# Patient Record
Sex: Female | Born: 1985 | Race: Black or African American | Hispanic: No | Marital: Single | Smoking: Never smoker
Health system: Southern US, Community
[De-identification: ages and names within clinical notes are randomized; demographics above are authoritative.]

## PROBLEM LIST (undated history)

## (undated) DIAGNOSIS — F419 Anxiety disorder, unspecified: Secondary | ICD-10-CM

## (undated) HISTORY — PX: TUBAL LIGATION: SHX77

## (undated) HISTORY — PX: CHOLECYSTECTOMY: SHX55

---

## 2020-09-20 ENCOUNTER — Other Ambulatory Visit: Payer: Self-pay

## 2020-09-20 ENCOUNTER — Encounter (HOSPITAL_COMMUNITY): Payer: Self-pay

## 2020-09-20 ENCOUNTER — Emergency Department (HOSPITAL_COMMUNITY)
Admission: EM | Admit: 2020-09-20 | Discharge: 2020-09-20 | Disposition: A | Discharge status: Home / Self Care | Payer: 59 | Attending: Emergency Medicine | Admitting: Emergency Medicine

## 2020-09-20 DIAGNOSIS — K0889 Other specified disorders of teeth and supporting structures: Secondary | ICD-10-CM | POA: Diagnosis not present

## 2020-09-20 MED ORDER — AMOXICILLIN 500 MG PO CAPS: 500.0000 mg | ORAL_CAPSULE | Freq: Three times a day (TID) | ORAL | 0 refills | Status: DC

## 2020-09-20 MED ORDER — AMOXICILLIN 500 MG PO CAPS
500.0000 mg | ORAL_CAPSULE | Freq: Once | ORAL | Status: AC
  Administered 2020-09-20: 500 mg via ORAL
  Filled 2020-09-20: qty 1

## 2020-09-20 NOTE — ED Triage Notes (Signed)
Pt reports dental pain that started Sunday that is now radiating to the rest of her face.

## 2020-09-20 NOTE — ED Provider Notes (Signed)
  [facility]-EMERGENCY DEPT Provider Note   CSN: [id number] Arrival date & time: 09/20/20  0005     History Chief Complaint  Patient presents with  . Dental Pain    Tiffany Welch is a 34 y.o. female.  The history is provided by the patient.  Dental Pain Location:  Upper and lower Quality:  Aching Severity:  Moderate Onset quality:  Gradual Duration:  6 days Timing:  Intermittent Progression:  Worsening Chronicity:  New Relieved by:  Nothing Associated symptoms: congestion   Associated symptoms: no facial swelling, no fever and no gum swelling         PMH-none Soc hx - just moved to [facility] History   No obstetric history on file.     History reviewed. No pertinent family history.  Social History   Tobacco Use  . Smoking status: Not on file  Substance Use Topics  . Alcohol use: Not on file  . Drug use: Not on file    Home Medications Prior to Admission medications   Medication Sig Start Date End Date Taking? Authorizing Provider  amoxicillin (AMOXIL) 500 MG capsule Take 1 capsule (500 mg total) by mouth 3 (three) times daily. 09/20/20   Zadie Rhine, MD    Allergies    Patient has no known allergies.  Review of Systems   Review of Systems  Constitutional: Negative for fever.  HENT: Positive for congestion. Negative for facial swelling.     Physical Exam Updated Vital Signs BP (!) 146/89 (BP Location: Right Arm)   Pulse 81   Temp 97.6 F (36.4 C) (Oral)   Resp 17   Ht 1.626 m (5\' 4" )   Wt 93.9 kg   SpO2 95%   BMI 35.53 kg/m   Physical Exam CONSTITUTIONAL: Well developed/well nourished HEAD: Normocephalic/atraumatic EYES: EOMI/PERRL ENMT: Mucous membranes moist, no facial swelling, no gingival abscess No trismus, no stridor.  Uvula midline without erythema or exudate No dental tenderness NECK: supple no meningeal signs LUNGS:  no apparent distress NEURO: Pt is awake/alert/appropriate, moves all  extremitiesx4.  No facial droop.   EXTREMITIES:  full ROM SKIN: warm, color normal PSYCH: no abnormalities of mood noted, alert and oriented to situation  ED Results / Procedures / Treatments   Labs (all labs ordered are listed, but only abnormal results are displayed) Labs Reviewed - No data to display  EKG None  Radiology No results found.  Procedures Procedures  Medications Ordered in ED Medications  amoxicillin (AMOXIL) capsule 500 mg (has no administration in time range)    ED Course  I have reviewed the triage vital signs and the nursing notes.      MDM Rules/Calculators/A&P                          Patient reports recent sinus infection now having upper and lower dental pain.  Also is having ear pain at times. Will treat with antibiotics as could be sinusitis that is worsening No obvious dental abscess Safe for d/c home  Final Clinical Impression(s) / ED Diagnoses Final diagnoses:  Pain, dental    Rx / DC Orders ED Discharge Orders         Ordered    amoxicillin (AMOXIL) 500 MG capsule  3 times daily        09/20/20 0357           14/04/21, MD 09/20/20 [phone]

## 2020-09-25 ENCOUNTER — Other Ambulatory Visit: Payer: Self-pay

## 2020-09-25 ENCOUNTER — Ambulatory Visit (HOSPITAL_COMMUNITY)
Admission: RE | Admit: 2020-09-25 | Discharge: 2020-09-25 | Disposition: A | Discharge status: Home / Self Care | Payer: 59 | Source: Ambulatory Visit | Attending: Family Medicine | Admitting: Family Medicine

## 2020-09-25 ENCOUNTER — Encounter (HOSPITAL_COMMUNITY): Payer: Self-pay

## 2020-09-25 VITALS — BP 103/63 | HR 87 | Temp 98.4°F | Resp 18

## 2020-09-25 DIAGNOSIS — N760 Acute vaginitis: Secondary | ICD-10-CM

## 2020-09-25 MED ORDER — FLUCONAZOLE 150 MG PO TABS: ORAL_TABLET | ORAL | 0 refills | Status: DC

## 2020-09-25 NOTE — ED Triage Notes (Signed)
Pt presents with vaginal irritation and itching following an antibiotic regimen for her tooth.

## 2020-09-25 NOTE — ED Notes (Signed)
Patient was discharged by dr hagler

## 2020-09-30 NOTE — ED Provider Notes (Signed)
  [facility]   [id number] 09/25/20 Arrival Time: 1839  ASSESSMENT & PLAN:  1. Acute vaginitis     Meds ordered this encounter  Medications  . fluconazole (DIFLUCAN) 150 MG tablet    Sig: Take one tablet by mouth as a single dose. May repeat in 3 days if symptoms persist.    Dispense:  2 tablet    Refill:  0   Without s/s of PID.  No STI testing desired.  Reviewed expectations re: course of current medical issues. Questions answered. Outlined signs and symptoms indicating need for more acute intervention. Patient verbalized understanding. After Visit Summary given.   SUBJECTIVE:  Tiffany Welch is a 34 y.o. female who reports that she is currently taking antibiotic. Feels she is getting a vaginal yeast infection which happens freq with antibiotic use. No abd pain, fever, n/v. Otherwise well. No OTC tx.  Patient's last menstrual period was 08/29/2020.   OBJECTIVE:  Vitals:   09/25/20 1916  BP: 103/63  Pulse: 87  Resp: 18  Temp: 98.4 F (36.9 C)  TempSrc: Oral  SpO2: 96%     General appearance: alert, cooperative, appears stated age and no distress Lungs: unlabored respirations; speaks full sentences without difficulty Abdomen: soft GU: deferred Skin: warm and dry Psychological: alert and cooperative; normal mood and affect.  No results found for this or any previous visit.  Labs Reviewed - No data to display  Allergies  Allergen Reactions  . Codeine   . Doxycycline     History reviewed. No pertinent past medical history. Family History  Family history unknown: Yes   Social History   Socioeconomic History  . Marital status: Single    Spouse name: Not on file  . Number of children: Not on file  . Years of education: Not on file  . Highest education level: Not on file  Occupational History  . Not on file  Tobacco Use  . Smoking status: Never Smoker  . Smokeless tobacco: Not on file  Substance and Sexual Activity  . Alcohol use: Not  on file  . Drug use: Not on file  . Sexual activity: Not on file  Other Topics Concern  . Not on file  Social History Narrative  . Not on file   Social Determinants of Health   Financial Resource Strain: Not on file  Food Insecurity: Not on file  Transportation Needs: Not on file  Physical Activity: Not on file  Stress: Not on file  Social Connections: Not on file  Intimate Partner Violence: Not on file          Mardella Layman, MD 09/30/20 0930

## 2020-10-09 ENCOUNTER — Encounter (HOSPITAL_COMMUNITY): Payer: Self-pay

## 2020-10-09 ENCOUNTER — Other Ambulatory Visit: Payer: Self-pay

## 2020-10-09 ENCOUNTER — Ambulatory Visit (HOSPITAL_COMMUNITY)
Admission: RE | Admit: 2020-10-09 | Discharge: 2020-10-09 | Disposition: A | Discharge status: Home / Self Care | Payer: 59 | Source: Ambulatory Visit | Attending: Emergency Medicine | Admitting: Emergency Medicine

## 2020-10-09 VITALS — BP 124/89 | HR 67 | Temp 98.2°F | Resp 18

## 2020-10-09 DIAGNOSIS — N898 Other specified noninflammatory disorders of vagina: Secondary | ICD-10-CM | POA: Insufficient documentation

## 2020-10-09 LAB — POCT URINALYSIS DIPSTICK, ED / UC
Bilirubin Urine: NEGATIVE
Glucose, UA: NEGATIVE mg/dL
Hgb urine dipstick: NEGATIVE
Ketones, ur: NEGATIVE mg/dL
Nitrite: NEGATIVE
Protein, ur: NEGATIVE mg/dL
Specific Gravity, Urine: 1.015 (ref 1.005–1.030)
Urobilinogen, UA: 0.2 mg/dL (ref 0.0–1.0)
pH: 7 (ref 5.0–8.0)

## 2020-10-09 MED ORDER — METRONIDAZOLE 500 MG PO TABS: 500.0000 mg | ORAL_TABLET | Freq: Two times a day (BID) | ORAL | 0 refills | Status: AC

## 2020-10-09 NOTE — ED Provider Notes (Signed)
[facility]  ____________________________________________  Time seen: Approximately 8:22 PM  I have reviewed the triage vital signs and the nursing notes.   HISTORY  Chief Complaint Vaginal Discharge and Appointment   Historian Patient     HPI Tiffany Welch is a 34 y.o. female presents to the urgent care with vaginal pruritus, irritation and changes in vaginal discharge.  Patient states that her current symptoms feel similar to episodes of BV she has had in the past.  She denies dyspareunia, fever, chills, dysuria or increased urinary frequency.  No low back pain.  No nausea or vomiting.  She has been in a monogamous relationship for a year with a single partner.   History reviewed. No pertinent past medical history.   Immunizations up to date:  Yes.     History reviewed. No pertinent past medical history.  There are no problems to display for this patient.   Past Surgical History:  Procedure Laterality Date  . CHOLECYSTECTOMY    . TUBAL LIGATION      Prior to Admission medications   Medication Sig Start Date End Date Taking? Authorizing Provider  amoxicillin (AMOXIL) 500 MG capsule Take 1 capsule (500 mg total) by mouth 3 (three) times daily. 09/20/20   Zadie Rhine, MD  fluconazole (DIFLUCAN) 150 MG tablet Take one tablet by mouth as a single dose. May repeat in 3 days if symptoms persist. 09/25/20   Mardella Layman, MD  metroNIDAZOLE (FLAGYL) 500 MG tablet Take 1 tablet (500 mg total) by mouth 2 (two) times daily for 7 days. 10/09/20 10/16/20  Orvil Feil, PA-C    Allergies Codeine and Doxycycline  Family History  Family history unknown: Yes    Social History Social History   Tobacco Use  . Smoking status: Never Smoker     Review of Systems  Constitutional: No fever/chills Eyes:  No discharge ENT: No upper respiratory complaints. Respiratory: no cough. No SOB/ use of accessory muscles to breath Gastrointestinal:   No nausea, no  vomiting.  No diarrhea.  No constipation. Genitourinary: Patient has vaginal irritation.  Musculoskeletal: Negative for musculoskeletal pain. Skin: Negative for rash, abrasions, lacerations, ecchymosis.    ____________________________________________   PHYSICAL EXAM:  VITAL SIGNS: ED Triage Vitals  Enc Vitals Group     BP 10/09/20 1853 124/89     Pulse Rate 10/09/20 1853 67     Resp 10/09/20 1852 18     Temp 10/09/20 1853 98.2 F (36.8 C)     Temp Source 10/09/20 1852 Oral     SpO2 10/09/20 1853 99 %     Weight --      Height --      Head Circumference --      Peak Flow --      Pain Score 10/09/20 1851 5     Pain Loc --      Pain Edu? --      Excl. in GC? --      Constitutional: Alert and oriented. Well appearing and in no acute distress. Eyes: Conjunctivae are normal. PERRL. EOMI. Head: Atraumatic. Cardiovascular: Normal rate, regular rhythm. Normal S1 and S2.  Good peripheral circulation. Respiratory: Normal respiratory effort without tachypnea or retractions. Lungs CTAB. Good air entry to the bases with no decreased or absent breath sounds Gastrointestinal: Bowel sounds x 4 quadrants. Soft and nontender to palpation. No guarding or rigidity. No distention. Genitourinary: No rash.  No erythema or edema of the labia majora or minora. Musculoskeletal: Full range of  motion to all extremities. No obvious deformities noted Neurologic:  Normal for age. No gross focal neurologic deficits are appreciated.  Skin:  Skin is warm, dry and intact. No rash noted. Psychiatric: Mood and affect are normal for age. Speech and behavior are normal.   ____________________________________________   LABS (all labs ordered are listed, but only abnormal results are displayed)  Labs Reviewed  POCT URINALYSIS DIPSTICK, ED / UC - Abnormal; Notable for the following components:      Result Value   Leukocytes,Ua SMALL (*)    All other components within normal limits  CERVICOVAGINAL  ANCILLARY ONLY   ____________________________________________  EKG   ____________________________________________  RADIOLOGY  No results found.  ____________________________________________    PROCEDURES  Procedure(s) performed:     Procedures     Medications - No data to display   ____________________________________________   INITIAL IMPRESSION / ASSESSMENT AND PLAN / ED COURSE  Pertinent labs & imaging results that were available during my care of the patient were reviewed by me and considered in my medical decision making (see chart for details).     Assessment and plan Vaginal irritation 34 year old female presents to the urgent care with vaginal irritation and changes in vaginal discharge for the past 2 to 3 days.  Vital signs were reassuring at triage.  Testing for gonorrhea, chlamydia, yeast and BV are in process at this time.  Patient was recently treated for yeast vaginitis.  She states that her current symptoms feel similar to episodes of BV in the past.  Will start patient on Flagyl while vaginal testing is in process.  Inform patient that we would update her management plan when results return.  All patient questions were answered.     ____________________________________________  FINAL CLINICAL IMPRESSION(S) / ED DIAGNOSES  Final diagnoses:  Vaginal irritation      NEW MEDICATIONS STARTED DURING THIS VISIT:  ED Discharge Orders         Ordered    metroNIDAZOLE (FLAGYL) 500 MG tablet  2 times daily        10/09/20 1950              This chart was dictated using voice recognition software/Dragon. Despite best efforts to proofread, errors can occur which can change the meaning. Any change was purely unintentional.     Orvil Feil, PA-C 10/09/20 2025

## 2020-10-09 NOTE — ED Triage Notes (Signed)
Pt presents with vaginal irritation, burning, and abnormal discharge for past few days.

## 2020-10-09 NOTE — Discharge Instructions (Signed)
Take Flagyl twice daily for seven days.  

## 2020-10-13 LAB — CERVICOVAGINAL ANCILLARY ONLY
Bacterial Vaginitis (gardnerella): NEGATIVE
Candida Glabrata: NEGATIVE
Candida Vaginitis: NEGATIVE
Chlamydia: NEGATIVE
Comment: NEGATIVE
Comment: NEGATIVE
Comment: NEGATIVE
Comment: NEGATIVE
Comment: NEGATIVE
Comment: NORMAL
Neisseria Gonorrhea: NEGATIVE
Trichomonas: NEGATIVE

## 2020-11-01 ENCOUNTER — Emergency Department (HOSPITAL_COMMUNITY)
Admission: EM | Admit: 2020-11-01 | Discharge: 2020-11-01 | Disposition: A | Discharge status: Home / Self Care | Payer: 59 | Attending: Emergency Medicine | Admitting: Emergency Medicine

## 2020-11-01 ENCOUNTER — Other Ambulatory Visit: Payer: Self-pay

## 2020-11-01 ENCOUNTER — Encounter (HOSPITAL_COMMUNITY): Payer: Self-pay | Admitting: Emergency Medicine

## 2020-11-01 DIAGNOSIS — R059 Cough, unspecified: Secondary | ICD-10-CM | POA: Diagnosis present

## 2020-11-01 DIAGNOSIS — U071 COVID-19: Secondary | ICD-10-CM | POA: Diagnosis not present

## 2020-11-01 DIAGNOSIS — J069 Acute upper respiratory infection, unspecified: Secondary | ICD-10-CM

## 2020-11-01 NOTE — ED Triage Notes (Signed)
Patient reports family is Covid+ and started having symptoms on 1/12. Requesting Covid test.

## 2020-11-01 NOTE — ED Provider Notes (Signed)
[facility]-EMERGENC[facility] DEPT Provider Note   CSN: [id number] Arrival date & time: 11/01/20  2109     History Chief Complaint  Patient presents with  . Covid Exposure    Tiffany Welch is a 35 y.o. female.  35 year old female presents with complaint of cough, shortness of breath, fevers, body aches. Patient was exposed to her daughter who tested positive for COVID earlier this week, patient's symptoms started 10/29/20. Patient is vaccinated, has not had her booster, no history of asthma or chronic lung disease and is a non smoker.   Tiffany Welch was evaluated in Emergency Department on 11/01/2020 for the symptoms described in the history of present illness. She was evaluated in the context of the global COVID-19 pandemic, which necessitated consideration that the patient might be at risk for infection with the SARS-CoV-2 virus that causes COVID-19. Institutional protocols and algorithms that pertain to the evaluation of patients at risk for COVID-19 are in a state of rapid change based on information released by regulatory bodies including the CDC and federal and state organizations. These policies and algorithms were followed during the patient's care in the ED.         History reviewed. No pertinent past medical history.  There are no problems to display for this patient.   Past Surgical History:  Procedure Laterality Date  . CHOLECYSTECTOMY    . TUBAL LIGATION       OB History   No obstetric history on file.     Family History  Family history unknown: Yes    Social History   Tobacco Use  . Smoking status: Never Smoker    Home Medications Prior to Admission medications   Medication Sig Start Date End Date Taking? Authorizing Provider  amoxicillin (AMOXIL) 500 MG capsule Take 1 capsule (500 mg total) by mouth 3 (three) times daily. 09/20/20   Tiffany Rhine, MD  fluconazole (DIFLUCAN) 150 MG tablet Take one tablet by mouth as a single dose. May  repeat in 3 days if symptoms persist. 09/25/20   Tiffany Layman, MD    Allergies    Codeine and Doxycycline  Review of Systems   Review of Systems  Constitutional: Positive for fever.  HENT: Positive for congestion.   Respiratory: Positive for cough and shortness of breath.   Gastrointestinal: Negative for diarrhea and vomiting.  Musculoskeletal: Positive for myalgias.  Skin: Negative for rash.  Allergic/Immunologic: Negative for immunocompromised state.  Neurological: Negative for weakness.  Psychiatric/Behavioral: Negative for confusion.  All other systems reviewed and are negative.   Physical Exam Updated Vital Signs BP (!) 141/93 (BP Location: Left Arm)   Pulse 95   Temp 98 F (36.7 C) (Oral)   Resp 18   Ht 5\' 4"  (1.626 m)   Wt 93.9 kg   LMP 10/31/2020   SpO2 97%   BMI 35.53 kg/m   Physical Exam Vitals and nursing note reviewed.  Constitutional:      General: She is not in acute distress.    Appearance: She is well-developed and well-nourished. She is not diaphoretic.  HENT:     Head: Normocephalic and atraumatic.  Eyes:     Conjunctiva/sclera: Conjunctivae normal.  Cardiovascular:     Rate and Rhythm: Normal rate and regular rhythm.     Heart sounds: Normal heart sounds.  Pulmonary:     Effort: Pulmonary effort is normal.     Breath sounds: Normal breath sounds.  Musculoskeletal:     Cervical back: Neck  supple.  Lymphadenopathy:     Cervical: No cervical adenopathy.  Skin:    General: Skin is warm and dry.     Findings: No erythema or rash.  Neurological:     Mental Status: She is alert and oriented to person, place, and time.  Psychiatric:        Mood and Affect: Mood and affect normal.        Behavior: Behavior normal.     ED Results / Procedures / Treatments   Labs (all labs ordered are listed, but only abnormal results are displayed) Labs Reviewed  SARS CORONAVIRUS 2 (TAT 6-24 HRS)    EKG None  Radiology No results  found.  Procedures Procedures (including critical care time)  Medications Ordered in ED Medications - No data to display  ED Course  I have reviewed the triage vital signs and the nursing notes.  Pertinent labs & imaging results that were available during my care of the patient were reviewed by me and considered in my medical decision making (see chart for details).  Clinical Course as of 11/01/20 2224  Sat Nov 01, 2020  [id number] 35 year old female with complaint of cough, SHOB, additional as above, onset 3 days ago, known COVID exposure, is vaccinated. On exam, well appearing, O2 sat 97-100% on room air without increased respiratory effort.  Offered reassurance, recommend home pulse ox, mucinex, motrin and tylenol. Return to ER for severe or concerning symptoms, home to quarantine pending test results.  [LM]    Clinical Course User Index [LM] Tiffany Welch   MDM Rules/Calculators/A&P                          Final Clinical Impression(s) / ED Diagnoses Final diagnoses:  Viral URI with cough    Rx / DC Orders ED Discharge Orders    None       Tiffany Welch 11/01/20 2224    Tiffany Files, MD 11/02/20 1006

## 2020-11-01 NOTE — Discharge Instructions (Signed)
Home to quarantine pending COVID test results which should be available in your MyChart in 24 hours.  Recommend Mucinex, Motrin, Tylenol as needed as directed. Return to ER for severe or concerning symptoms, follow up with your doctor or COVID clinic as needed.

## 2020-11-02 LAB — SARS CORONAVIRUS 2 (TAT 6-24 HRS): SARS Coronavirus 2: POSITIVE — AB

## 2020-11-03 ENCOUNTER — Telehealth: Payer: Self-pay | Admitting: Physician Assistant

## 2020-11-03 ENCOUNTER — Telehealth: Payer: Self-pay | Admitting: *Deleted

## 2020-11-03 NOTE — Telephone Encounter (Signed)
Called todiscuss with patient about COVID-19 symptoms and the use of one of the available treatments for those with mild to moderate Covid symptoms and at a high risk of hospitalization. Pt appears to qualify for outpatient treatment due to co-morbid conditions and/or a member of an at-risk group in accordance with the FDA Emergency Use Authorization.   Symptom onset: 10/29/20  Today is day 6 Vaccinated: yes Booster? no Immunocompromised? no Qualifiers: BMI>25, African American  Patient is interested in treatment. Unfortunately, pt is a single mother with young kids and no childcare  Cline Crock PA-C  [facility]  .

## 2020-11-03 NOTE — Telephone Encounter (Signed)
Called to discuss with patient about COVID-19 symptoms and the use of one of the available treatments for those with mild to moderate Covid symptoms and at a high risk of hospitalization.  Pt appears to qualify for outpatient treatment due to co-morbid conditions and/or a member of an at-risk group in accordance with the FDA Emergency Use Authorization.    Symptom onset: 10/29/20  Today is day 6 Vaccinated: yes Booster? no Immunocompromised? no Qualifiers: BMI>25, African American  Patient is interested in treatment.  Tiffany Welch

## 2021-02-16 ENCOUNTER — Ambulatory Visit: Payer: 59 | Admitting: Nurse Practitioner

## 2021-03-23 ENCOUNTER — Encounter: Payer: 59 | Admitting: Nurse Practitioner

## 2021-08-21 ENCOUNTER — Ambulatory Visit (HOSPITAL_COMMUNITY)
Admission: EM | Admit: 2021-08-21 | Discharge: 2021-08-21 | Disposition: A | Discharge status: Home / Self Care | Payer: Medicaid Other | Attending: Emergency Medicine | Admitting: Emergency Medicine

## 2021-08-21 ENCOUNTER — Other Ambulatory Visit: Payer: Self-pay

## 2021-08-21 ENCOUNTER — Encounter (HOSPITAL_COMMUNITY): Payer: Self-pay

## 2021-08-21 DIAGNOSIS — Z20822 Contact with and (suspected) exposure to covid-19: Secondary | ICD-10-CM

## 2021-08-21 DIAGNOSIS — J111 Influenza due to unidentified influenza virus with other respiratory manifestations: Secondary | ICD-10-CM | POA: Insufficient documentation

## 2021-08-21 MED ORDER — AEROCHAMBER PLUS MISC: 2 refills | Status: DC

## 2021-08-21 MED ORDER — ONDANSETRON 8 MG PO TBDP: 8.0000 mg | ORAL_TABLET | Freq: Three times a day (TID) | ORAL | 0 refills | Status: DC | PRN

## 2021-08-21 MED ORDER — OSELTAMIVIR PHOSPHATE 75 MG PO CAPS: 75.0000 mg | ORAL_CAPSULE | Freq: Two times a day (BID) | ORAL | 0 refills | Status: DC

## 2021-08-21 MED ORDER — PROMETHAZINE-DM 6.25-15 MG/5ML PO SYRP: 5.0000 mL | ORAL_SOLUTION | Freq: Four times a day (QID) | ORAL | 0 refills | Status: DC | PRN

## 2021-08-21 MED ORDER — ALBUTEROL SULFATE HFA 108 (90 BASE) MCG/ACT IN AERS: 1.0000 | INHALATION_SPRAY | RESPIRATORY_TRACT | 0 refills | Status: DC | PRN

## 2021-08-21 MED ORDER — IBUPROFEN 600 MG PO TABS: 600.0000 mg | ORAL_TABLET | Freq: Four times a day (QID) | ORAL | 0 refills | Status: DC | PRN

## 2021-08-21 NOTE — ED Provider Notes (Signed)
HPI  SUBJECTIVE:  Tiffany Welch is a 35 y.o. female who presents with 2 days of chest congestion, fevers 101.8 with chills, nasal congestion, postnasal drip, sore throat, cough productive of yellow mucus, wheezing, chest soreness secondary to the cough, diarrhea, nausea.  No body aches, headaches, rhinorrhea, loss of sense of smell or taste, shortness of breath, abdominal pain, vomiting.  She was exposed to the flu.  No known COVID exposure.  She got the second dose of COVID-vaccine.  She has not yet gotten the flu vaccine.  Her son had similar symptoms last week.  She has been alternating Tylenol and ibuprofen every 4 hours, pushing fluids, tried Delsym.  The Tylenol and ibuprofen help.  No aggravating factors.  She is sleeping okay without waking up coughing.  She took ibuprofen within 6 hours of evaluation.  She had COVID in January 2022.  LMP: Yesterday.  Denies the possibility of being pregnant.  PMD: None  History reviewed. No pertinent past medical history.  Past Surgical History:  Procedure Laterality Date   CHOLECYSTECTOMY     TUBAL LIGATION      Family History  Family history unknown: Yes    Social History   Tobacco Use   Smoking status: Never   Smokeless tobacco: Never  Substance Use Topics   Alcohol use: Not Currently   Drug use: Never    No current facility-administered medications for this encounter.  Current Outpatient Medications:    albuterol (VENTOLIN HFA) 108 (90 Base) MCG/ACT inhaler, Inhale 1-2 puffs into the lungs every 4 (four) hours as needed for wheezing or shortness of breath., Disp: 1 each, Rfl: 0   ibuprofen (ADVIL) 600 MG tablet, Take 1 tablet (600 mg total) by mouth every 6 (six) hours as needed., Disp: 30 tablet, Rfl: 0   ondansetron (ZOFRAN ODT) 8 MG disintegrating tablet, Take 1 tablet (8 mg total) by mouth every 8 (eight) hours as needed for nausea or vomiting., Disp: 20 tablet, Rfl: 0   oseltamivir (TAMIFLU) 75 MG capsule, Take 1 capsule (75 mg  total) by mouth 2 (two) times daily. X 5 days, Disp: 10 capsule, Rfl: 0   promethazine-dextromethorphan (PROMETHAZINE-DM) 6.25-15 MG/5ML syrup, Take 5 mLs by mouth 4 (four) times daily as needed for cough., Disp: 118 mL, Rfl: 0   Spacer/Aero-Holding Chambers (AEROCHAMBER PLUS) inhaler, Use with inhaler, Disp: 1 each, Rfl: 2  Allergies  Allergen Reactions   Codeine    Doxycycline      ROS  As noted in HPI.   Physical Exam  BP 127/80 (BP Location: Right Arm)   Pulse 77   Temp 98.6 F (37 C) (Oral)   Resp 18   LMP 08/19/2021 (Exact Date)   SpO2 95%   Constitutional: Well developed, well nourished, no acute distress Eyes: PERRL, EOMI, conjunctiva normal bilaterally HENT: Normocephalic, atraumatic,mucus membranes moist.  Positive nasal congestion.  Normal turbinates.  No maxillary, frontal sinus tenderness.  Large tonsils.  No erythema.  No exudates.  Uvula midline. Neck: Positive cervical lymphadenopathy Respiratory: Clear to auscultation bilaterally, no rales, no wheezing, no rhonchi.  No chest wall tenderness. Cardiovascular: Normal rate and rhythm, no murmurs, no gallops, no rubs, cap refill less than 2 seconds GI: Soft, nondistended, normal bowel sounds, nontender, no rebound, no guarding skin: No rash, skin intact Musculoskeletal: No edema, no tenderness, no deformities Neurologic: Alert & oriented x 3, CN III-XII grossly intact, no motor deficits, sensation grossly intact Psychiatric: Speech and behavior appropriate   ED Course  Medications - No data to display  Orders Placed This Encounter  Procedures   SARS CORONAVIRUS 2 (TAT 6-24 HRS) Nasopharyngeal Nasopharyngeal Swab    Standing Status:   Standing    Number of Occurrences:   1   Results for orders placed or performed during the hospital encounter of 08/21/21 (from the past 24 hour(s))  SARS CORONAVIRUS 2 (TAT 6-24 HRS) Nasopharyngeal Nasopharyngeal Swab     Status: None   Collection Time: 08/21/21  4:01 PM    Specimen: Nasopharyngeal Swab  Result Value Ref Range   SARS Coronavirus 2 NEGATIVE NEGATIVE   No results found.  ED Clinical Impression  1. Influenza   2. Encounter for laboratory testing for COVID-19 virus      ED Assessment/Plan   Presentation consistent with influenza.  Did not check a flu as it would not change management.  Discussed this with patient and she is amenable to this.  Sending home with Zofran, Tylenol/ibuprofen, Tamiflu, Promethazine DM.  Regularly scheduled albuterol inhaler with a spacer for the next 4 days, then as needed.  Checking COVID.  She will discontinue the Tamiflu if her COVID is positive.  Will provide primary care list and order assistance in finding a PMD.  ER return precautions given.  COVID-negative.  Plan as above.  Discussed labs, MDM, treatment plan, and plan for follow-up with patient Discussed sn/sx that should prompt return to the ED. patient agrees with plan.   Meds ordered this encounter  Medications   albuterol (VENTOLIN HFA) 108 (90 Base) MCG/ACT inhaler    Sig: Inhale 1-2 puffs into the lungs every 4 (four) hours as needed for wheezing or shortness of breath.    Dispense:  1 each    Refill:  0   Spacer/Aero-Holding Chambers (AEROCHAMBER PLUS) inhaler    Sig: Use with inhaler    Dispense:  1 each    Refill:  2    Please educate patient on use   ibuprofen (ADVIL) 600 MG tablet    Sig: Take 1 tablet (600 mg total) by mouth every 6 (six) hours as needed.    Dispense:  30 tablet    Refill:  0   oseltamivir (TAMIFLU) 75 MG capsule    Sig: Take 1 capsule (75 mg total) by mouth 2 (two) times daily. X 5 days    Dispense:  10 capsule    Refill:  0   promethazine-dextromethorphan (PROMETHAZINE-DM) 6.25-15 MG/5ML syrup    Sig: Take 5 mLs by mouth 4 (four) times daily as needed for cough.    Dispense:  118 mL    Refill:  0   ondansetron (ZOFRAN ODT) 8 MG disintegrating tablet    Sig: Take 1 tablet (8 mg total) by mouth every 8 (eight)  hours as needed for nausea or vomiting.    Dispense:  20 tablet    Refill:  0      *This clinic note was created using Scientist, clinical (histocompatibility and immunogenetics). Therefore, there may be occasional mistakes despite careful proofreading. ?    Domenick Gong, MD 08/22/21 [phone]

## 2021-08-21 NOTE — ED Triage Notes (Signed)
Pt reports cough, fever 101.8 F, chills and diarrhea x 1 day, Pt alternation Tylenol and Motrin, last dose 1200 today.

## 2021-08-21 NOTE — Discharge Instructions (Addendum)
Take 600 mg of ibuprofen combined with 1000 mg of Tylenol together 3-4 times a day as needed.  Start the Tamiflu today.  If your COVID comes back positive, discontinue the Tamiflu.  It will be back tomorrow.  We can consider putting you on an antiviral if your COVID is positive.  Zofran for nausea, diarrhea.  Continue pushing plenty of extra electrolyte containing fluids such as Pedialyte, Gatorade.  2 puffs from albuterol inhaler using your spacer every 4 hours for 2 days, then every 6 hours for 2 days, then as needed.  You may back off on the albuterol if you start to feel better sooner.  Promethazine DM for the cough.  Below is a list of primary care practices who are taking new patients for you to follow-up with.  [facility] internal medicine clinic Ground Floor - [facility], [address] [place], [place], [place] [address] [phone]  [facility] Primary Care at [facility] [address] [place], [place] [address] [phone]  [facility] [address] [place], [place] [address] [phone]  [facility] [phone] [address] [place] [address]  [facility]: [address] [place] [address]  [phone]  [facility]: [address] [place] [address]  [phone]  [facility] : [address] [place] [address] [phone]  [facility] Primary Care: [address] [place] [address] [phone]  [facility]: [address] [place] [address] [phone]  Dr. Oneal Grout [address] [facility] [place] [address]  [phone]  Go to www.goodrx.com  or www.costplusdrugs.com to look up your medications. This will give you a list of where you can find your prescriptions  at the most affordable prices. Or ask the pharmacist what the cash price is, or if they have any other discount programs available to help make your medication more affordable. This can be less expensive than what you would pay with insurance.

## 2021-08-22 LAB — SARS CORONAVIRUS 2 (TAT 6-24 HRS): SARS Coronavirus 2: NEGATIVE

## 2021-09-11 ENCOUNTER — Encounter (HOSPITAL_COMMUNITY): Payer: Self-pay

## 2021-09-11 ENCOUNTER — Ambulatory Visit (HOSPITAL_COMMUNITY)
Admission: EM | Admit: 2021-09-11 | Discharge: 2021-09-11 | Disposition: A | Discharge status: Home / Self Care | Payer: Medicaid Other | Attending: Emergency Medicine | Admitting: Emergency Medicine

## 2021-09-11 ENCOUNTER — Other Ambulatory Visit: Payer: Self-pay

## 2021-09-11 DIAGNOSIS — N760 Acute vaginitis: Secondary | ICD-10-CM

## 2021-09-11 LAB — POCT URINALYSIS DIPSTICK, ED / UC
Bilirubin Urine: NEGATIVE
Glucose, UA: NEGATIVE mg/dL
Nitrite: NEGATIVE
Protein, ur: NEGATIVE mg/dL
Specific Gravity, Urine: 1.025 (ref 1.005–1.030)
Urobilinogen, UA: 4 mg/dL — ABNORMAL HIGH (ref 0.0–1.0)
pH: 7 (ref 5.0–8.0)

## 2021-09-11 LAB — POC URINE PREG, ED: Preg Test, Ur: NEGATIVE

## 2021-09-11 MED ORDER — METRONIDAZOLE 0.75 % VA GEL: 1.0000 | Freq: Every day | VAGINAL | 0 refills | Status: AC

## 2021-09-11 NOTE — ED Triage Notes (Signed)
Pt presents with lower abdominal pain X 3 days with no reported urinary symptoms.

## 2021-09-11 NOTE — ED Provider Notes (Signed)
[facility]    CSN: [id number] Arrival date & time: 09/11/21  1742    HISTORY   Chief Complaint  Patient presents with   Abdominal Pain   HPI Tiffany Welch is a 35 y.o. female. Patient presents with 3 days of lower abdominal pain along with vaginal dryness.  Patient states she has had bacterial vaginitis several times in the past and symptoms today are similar.  Patient denies burning with urination, incomplete emptying, increased urinary frequency, hesitancy, genital lesion, abnormal vaginal discharge, foul-smelling discharge.  Patient states she has been trying to take more probiotics to "balance herself out".  Patient states she has not had any relief.    The history is provided by the patient.  History reviewed. No pertinent past medical history. There are no problems to display for this patient.  Past Surgical History:  Procedure Laterality Date   CHOLECYSTECTOMY     TUBAL LIGATION     OB History   No obstetric history on file.    Home Medications    Prior to Admission medications   Medication Sig Start Date End Date Taking? Authorizing Provider  metroNIDAZOLE (METROGEL) 0.75 % vaginal gel Place 1 Applicatorful vaginally at bedtime for 5 days. Abstain from sexual intercourse until treatment is complete 09/11/21 09/16/21 Yes Theadora Rama Scales, PA-C  albuterol (VENTOLIN HFA) 108 (90 Base) MCG/ACT inhaler Inhale 1-2 puffs into the lungs every 4 (four) hours as needed for wheezing or shortness of breath. 08/21/21   Domenick Gong, MD  ibuprofen (ADVIL) 600 MG tablet Take 1 tablet (600 mg total) by mouth every 6 (six) hours as needed. 08/21/21   Domenick Gong, MD  ondansetron (ZOFRAN ODT) 8 MG disintegrating tablet Take 1 tablet (8 mg total) by mouth every 8 (eight) hours as needed for nausea or vomiting. 08/21/21   Domenick Gong, MD  oseltamivir (TAMIFLU) 75 MG capsule Take 1 capsule (75 mg total) by mouth 2 (two) times daily. X 5 days 08/21/21    Domenick Gong, MD  promethazine-dextromethorphan (PROMETHAZINE-DM) 6.25-15 MG/5ML syrup Take 5 mLs by mouth 4 (four) times daily as needed for cough. 08/21/21   Domenick Gong, MD  Spacer/Aero-Holding Chambers (AEROCHAMBER PLUS) inhaler Use with inhaler 08/21/21   Domenick Gong, MD   Family History Family History  Family history unknown: Yes   Social History Social History   Tobacco Use   Smoking status: Never   Smokeless tobacco: Never  Substance Use Topics   Alcohol use: Not Currently   Drug use: Never   Allergies   Codeine and Doxycycline  Review of Systems Review of Systems Pertinent findings noted in history of present illness.   Physical Exam Triage Vital Signs ED Triage Vitals  Enc Vitals Group     BP 08/14/21 0827 (!) 147/82     Pulse Rate 08/14/21 0827 72     Resp 08/14/21 0827 18     Temp 08/14/21 0827 98.3 F (36.8 C)     Temp Source 08/14/21 0827 Oral     SpO2 08/14/21 0827 98 %     Weight --      Height --      Head Circumference --      Peak Flow --      Pain Score 08/14/21 0826 5     Pain Loc --      Pain Edu? --      Excl. in GC? --   No data found.  Updated Vital Signs BP (!) 122/55 (BP Location:  Left Arm)   Pulse 61   Temp 98.2 F (36.8 C) (Oral)   Resp 18   LMP 08/19/2021 (Exact Date)   SpO2 98%   Physical Exam Vitals and nursing note reviewed.  Constitutional:      General: She is not in acute distress.    Appearance: Normal appearance. She is not ill-appearing.  HENT:     Head: Normocephalic and atraumatic.  Eyes:     General: Lids are normal.        Right eye: No discharge.        Left eye: No discharge.     Extraocular Movements: Extraocular movements intact.     Conjunctiva/sclera: Conjunctivae normal.     Right eye: Right conjunctiva is not injected.     Left eye: Left conjunctiva is not injected.  Neck:     Trachea: Trachea and phonation normal.  Cardiovascular:     Rate and Rhythm: Normal rate and regular  rhythm.     Pulses: Normal pulses.     Heart sounds: Normal heart sounds. No murmur heard.   No friction rub. No gallop.  Pulmonary:     Effort: Pulmonary effort is normal. No accessory muscle usage, prolonged expiration or respiratory distress.     Breath sounds: Normal breath sounds. No stridor, decreased air movement or transmitted upper airway sounds. No decreased breath sounds, wheezing, rhonchi or rales.  Chest:     Chest wall: No tenderness.  Genitourinary:    Comments: Pt politely declines GU exam, pt did provide a swab for testing.   Musculoskeletal:        General: Normal range of motion.     Cervical back: Normal range of motion and neck supple. Normal range of motion.  Lymphadenopathy:     Cervical: No cervical adenopathy.  Skin:    General: Skin is warm and dry.     Findings: No erythema or rash.  Neurological:     General: No focal deficit present.     Mental Status: She is alert and oriented to person, place, and time.  Psychiatric:        Mood and Affect: Mood normal.        Behavior: Behavior normal.    Visual Acuity Right Eye Distance:   Left Eye Distance:   Bilateral Distance:    Right Eye Near:   Left Eye Near:    Bilateral Near:     UC Couse / Diagnostics / Procedures:    EKG  Radiology No results found.  Procedures Procedures (including critical care time)  UC Diagnoses / Final Clinical Impressions(s)    Final diagnoses:  Vaginitis and vulvovaginitis   I have reviewed the triage vital signs and the nursing notes.  Pertinent labs & imaging results that were available during my care of the patient were reviewed by me and considered in my medical decision making (see chart for details).    Patient is here today requesting treatment for BV.  Patient states she is had BV in the past and symptoms are similar today.  We will provide her with a prescription for metronidazole gel for 5 nights.  Patient provided vaginal swab, she is advised that  once the results are received if any further testing is needed that will be provided for her as well.  Urine dip today is concerning for dehydration.  Patient encouraged to drink more water. Patient/parent/caregiver verbalized understanding and agreement of plan as discussed.  All questions were addressed during visit.  Please see discharge instructions below for further details of plan.  ED Prescriptions     Medication Sig Dispense Auth. Provider   metroNIDAZOLE (METROGEL) 0.75 % vaginal gel Place 1 Applicatorful vaginally at bedtime for 5 days. Abstain from sexual intercourse until treatment is complete 50 g Theadora Rama Scales, PA-C      PDMP not reviewed this encounter.  Pending results:  Labs Reviewed  POCT URINALYSIS DIPSTICK, ED / UC - Abnormal; Notable for the following components:      Result Value   Ketones, ur TRACE (*)    Hgb urine dipstick SMALL (*)    Urobilinogen, UA 4.0 (*)    Leukocytes,Ua SMALL (*)    All other components within normal limits  POC URINE PREG, ED  CERVICOVAGINAL ANCILLARY ONLY     Medications Ordered in UC: Medications - No data to display  Discharge Instructions:   Discharge Instructions      Based on history provided today, I am happy to empirically treat you for bacterial vaginitis.  I believe its good idea to continue taking the probiotics, I have added a prescription for metronidazole vaginal gel to be used for the next 5 nights which is much safer to take than the metronidazole tablets because it will not cause systemic side effects.  Your urinalysis is concerning for dehydration, please be sure that you are drinking plenty of water every day, at least 80 ounces to stay well-hydrated.  The results of your STD testing today will be made available to you once received.  They will be posted to your MyChart and, if any of your results are abnormal, you will receive a phone call with those results along with further instructions regarding  treatment.       Disposition Upon Discharge:  Patient presented with an acute illness with associated systemic symptoms and significant discomfort requiring urgent management. In my opinion, this is a condition that a prudent lay person (someone who possesses an average knowledge of health and medicine) may potentially expect to result in complications if not addressed urgently such as respiratory distress, impairment of bodily function or dysfunction of bodily organs.   Routine symptom specific, illness specific and/or disease specific instructions were discussed with the patient and/or caregiver at length.   As such, the patient has been evaluated and assessed, work-up was performed and treatment was provided in alignment with urgent care protocols and evidence based medicine.  Patient/parent/caregiver has been advised that the patient may require follow up for further testing and treatment if the symptoms continue in spite of treatment, as clinically indicated and appropriate.  If the patient was tested for COVID-19, Influenza and/or RSV, then the patient/parent/guardian was advised to isolate at home pending the results of his/her diagnostic coronavirus test and potentially longer if they're positive. I have also advised pt that if his/her COVID-19 test returns positive, it's recommended to self-isolate for at least 10 days after symptoms first appeared AND until fever-free for 24 hours without fever reducer AND other symptoms have improved or resolved. Discussed self-isolation recommendations as well as instructions for household member/close contacts as per the [facility] and [place] DHHS, and also gave patient the COVID packet with this information.  Patient/parent/caregiver has been advised to return to the [facility] or PCP in 3-5 days if no better; to PCP or the Emergency Department if new signs and symptoms develop, or if the current signs or symptoms continue to change or worsen for further workup,  evaluation and treatment as clinically  indicated and appropriate  The patient will follow up with their current PCP if and as advised. If the patient does not currently have a PCP we will assist them in obtaining one.   The patient may need specialty follow up if the symptoms continue, in spite of conservative treatment and management, for further workup, evaluation, consultation and treatment as clinically indicated and appropriate.  Condition: stable for discharge home Home: take medications as prescribed; routine discharge instructions as discussed; follow up as advised.    Theadora Rama Scales, [place] 09/11/21 [phone]

## 2021-09-11 NOTE — Discharge Instructions (Addendum)
Based on history provided today, I am happy to empirically treat you for bacterial vaginitis.  I believe its good idea to continue taking the probiotics, I have added a prescription for metronidazole vaginal gel to be used for the next 5 nights which is much safer to take than the metronidazole tablets because it will not cause systemic side effects.  Your urinalysis is concerning for dehydration, please be sure that you are drinking plenty of water every day, at least 80 ounces to stay well-hydrated.  The results of your STD testing today will be made available to you once received.  They will be posted to your MyChart and, if any of your results are abnormal, you will receive a phone call with those results along with further instructions regarding treatment.

## 2021-09-14 LAB — CERVICOVAGINAL ANCILLARY ONLY
Bacterial Vaginitis (gardnerella): NEGATIVE
Candida Glabrata: NEGATIVE
Candida Vaginitis: NEGATIVE
Chlamydia: NEGATIVE
Comment: NEGATIVE
Comment: NEGATIVE
Comment: NEGATIVE
Comment: NEGATIVE
Comment: NEGATIVE
Comment: NORMAL
Neisseria Gonorrhea: NEGATIVE
Trichomonas: NEGATIVE

## 2021-11-13 ENCOUNTER — Other Ambulatory Visit: Payer: Self-pay

## 2021-11-13 ENCOUNTER — Emergency Department (HOSPITAL_BASED_OUTPATIENT_CLINIC_OR_DEPARTMENT_OTHER)
Admission: EM | Admit: 2021-11-13 | Discharge: 2021-11-13 | Disposition: A | Discharge status: Home / Self Care | Payer: Self-pay | Attending: Emergency Medicine | Admitting: Emergency Medicine

## 2021-11-13 ENCOUNTER — Encounter (HOSPITAL_BASED_OUTPATIENT_CLINIC_OR_DEPARTMENT_OTHER): Payer: Self-pay | Admitting: *Deleted

## 2021-11-13 DIAGNOSIS — Z20822 Contact with and (suspected) exposure to covid-19: Secondary | ICD-10-CM | POA: Insufficient documentation

## 2021-11-13 DIAGNOSIS — R42 Dizziness and giddiness: Secondary | ICD-10-CM | POA: Insufficient documentation

## 2021-11-13 DIAGNOSIS — R110 Nausea: Secondary | ICD-10-CM | POA: Insufficient documentation

## 2021-11-13 DIAGNOSIS — R519 Headache, unspecified: Secondary | ICD-10-CM | POA: Insufficient documentation

## 2021-11-13 LAB — CBC WITH DIFFERENTIAL/PLATELET
Abs Immature Granulocytes: 0.01 10*3/uL (ref 0.00–0.07)
Basophils Absolute: 0 10*3/uL (ref 0.0–0.1)
Basophils Relative: 0 %
Eosinophils Absolute: 0.1 10*3/uL (ref 0.0–0.5)
Eosinophils Relative: 1 %
HCT: 36.8 % (ref 36.0–46.0)
Hemoglobin: 12.2 g/dL (ref 12.0–15.0)
Immature Granulocytes: 0 %
Lymphocytes Relative: 19 %
Lymphs Abs: 1.1 10*3/uL (ref 0.7–4.0)
MCH: 30.1 pg (ref 26.0–34.0)
MCHC: 33.2 g/dL (ref 30.0–36.0)
MCV: 90.9 fL (ref 80.0–100.0)
Monocytes Absolute: 0.4 10*3/uL (ref 0.1–1.0)
Monocytes Relative: 7 %
Neutro Abs: 4.3 10*3/uL (ref 1.7–7.7)
Neutrophils Relative %: 73 %
Platelets: 257 10*3/uL (ref 150–400)
RBC: 4.05 MIL/uL (ref 3.87–5.11)
RDW: 13.2 % (ref 11.5–15.5)
WBC: 5.9 10*3/uL (ref 4.0–10.5)
nRBC: 0 % (ref 0.0–0.2)

## 2021-11-13 LAB — COMPREHENSIVE METABOLIC PANEL
ALT: 9 U/L (ref 0–44)
AST: 12 U/L — ABNORMAL LOW (ref 15–41)
Albumin: 4.1 g/dL (ref 3.5–5.0)
Alkaline Phosphatase: 58 U/L (ref 38–126)
Anion gap: 7 (ref 5–15)
BUN: 8 mg/dL (ref 6–20)
CO2: 28 mmol/L (ref 22–32)
Calcium: 8.8 mg/dL — ABNORMAL LOW (ref 8.9–10.3)
Chloride: 104 mmol/L (ref 98–111)
Creatinine, Ser: 0.66 mg/dL (ref 0.44–1.00)
GFR, Estimated: >60 mL/min (ref >60)
Glucose, Bld: 97 mg/dL (ref 70–99)
Potassium: 3.6 mmol/L (ref 3.5–5.1)
Sodium: 139 mmol/L (ref 135–145)
Total Bilirubin: 0.5 mg/dL (ref 0.3–1.2)
Total Protein: 7.2 g/dL (ref 6.5–8.1)

## 2021-11-13 LAB — PREGNANCY, URINE: Preg Test, Ur: NEGATIVE

## 2021-11-13 LAB — RESP PANEL BY RT-PCR (FLU A&B, COVID) ARPGX2
Influenza A by PCR: NEGATIVE
Influenza B by PCR: NEGATIVE
SARS Coronavirus 2 by RT PCR: NEGATIVE

## 2021-11-13 MED ORDER — SODIUM CHLORIDE 0.9 % IV BOLUS
1000.0000 mL | Freq: Once | INTRAVENOUS | Status: AC
  Administered 2021-11-13: 1000 mL via INTRAVENOUS

## 2021-11-13 MED ORDER — KETOROLAC TROMETHAMINE 15 MG/ML IJ SOLN
15.0000 mg | Freq: Once | INTRAMUSCULAR | Status: AC
  Administered 2021-11-13: 15 mg via INTRAVENOUS
  Filled 2021-11-13: qty 1

## 2021-11-13 MED ORDER — METOCLOPRAMIDE HCL 5 MG/ML IJ SOLN
10.0000 mg | Freq: Once | INTRAMUSCULAR | Status: AC
  Administered 2021-11-13: 10 mg via INTRAVENOUS
  Filled 2021-11-13: qty 2

## 2021-11-13 MED ORDER — DEXAMETHASONE SODIUM PHOSPHATE 10 MG/ML IJ SOLN
10.0000 mg | Freq: Once | INTRAMUSCULAR | Status: AC
Start: 2021-11-13 — End: 2021-11-13
  Administered 2021-11-13: 10 mg via INTRAVENOUS
  Filled 2021-11-13: qty 1

## 2021-11-13 MED ORDER — DIPHENHYDRAMINE HCL 25 MG PO CAPS
50.0000 mg | ORAL_CAPSULE | Freq: Once | ORAL | Status: AC
Start: 2021-11-13 — End: 2021-11-13
  Administered 2021-11-13: 50 mg via ORAL
  Filled 2021-11-13: qty 2

## 2021-11-13 NOTE — ED Triage Notes (Incomplete)
Arrived ems from work c/o migraine  light headed,  took ibu 600 mg,  some diarrhea,  started pieroid 2 days ago

## 2021-11-13 NOTE — ED Provider Notes (Signed)
[facility] DEPARTMENT Provider Note   CSN: [id number] Arrival date & time: 11/13/21  1152     History  Chief Complaint  Patient presents with   Migraine    Tiffany Welch is a 36 y.o. female presenting for evaluation of headache, lightheadedness/dizziness.   Patient states she woke up this morning not feeling well.  She was at work when she had a gradual onset severe headache.  She states this feels different from any headache she has had before.  She reports associated nausea, no vomiting.  Associated photophobia.  She also started to feel shaky and lightheaded, this is worse when she stands up.  She ate, drink, toxicity milligrams of ibuprofen without improvement of symptoms, has not taken anything else.  She denies recent fevers, nasal congestion, sore throat, cough, chest pain, abdominal pain, urinary symptoms, normal bowel movements.  She started her period 2 days ago, right time for her and it is heavy like it normally is.  HPI     Home Medications Prior to Admission medications   Medication Sig Start Date End Date Taking? Authorizing Provider  albuterol (VENTOLIN HFA) 108 (90 Base) MCG/ACT inhaler Inhale 1-2 puffs into the lungs every 4 (four) hours as needed for wheezing or shortness of breath. 08/21/21   Melynda Ripple, MD  ibuprofen (ADVIL) 600 MG tablet Take 1 tablet (600 mg total) by mouth every 6 (six) hours as needed. 08/21/21   Melynda Ripple, MD  ondansetron (ZOFRAN ODT) 8 MG disintegrating tablet Take 1 tablet (8 mg total) by mouth every 8 (eight) hours as needed for nausea or vomiting. 08/21/21   Melynda Ripple, MD  oseltamivir (TAMIFLU) 75 MG capsule Take 1 capsule (75 mg total) by mouth 2 (two) times daily. X 5 days 08/21/21   Melynda Ripple, MD  promethazine-dextromethorphan (PROMETHAZINE-DM) 6.25-15 MG/5ML syrup Take 5 mLs by mouth 4 (four) times daily as needed for cough. 08/21/21   Melynda Ripple, MD  Spacer/Aero-Holding Chambers  (AEROCHAMBER PLUS) inhaler Use with inhaler 08/21/21   Melynda Ripple, MD      Allergies    Codeine and Doxycycline    Review of Systems   Review of Systems  Gastrointestinal:  Positive for nausea.  Neurological:  Positive for light-headedness and headaches.  All other systems reviewed and are negative.  Physical Exam Updated Vital Signs BP (!) 99/50    Pulse 60    Temp 98.2 F (36.8 C) (Oral)    Resp 18    Ht 5\' 4"  (1.626 m)    Wt 98.4 kg    LMP 11/11/2021    SpO2 97%    BMI 37.25 kg/m  Physical Exam Vitals and nursing note reviewed.  Constitutional:      General: She is not in acute distress.    Appearance: Normal appearance.     Comments: Resting in the bed in no acute distress  HENT:     Head: Normocephalic and atraumatic.  Eyes:     Conjunctiva/sclera: Conjunctivae normal.     Pupils: Pupils are equal, round, and reactive to light.  Cardiovascular:     Rate and Rhythm: Normal rate and regular rhythm.     Pulses: Normal pulses.  Pulmonary:     Effort: Pulmonary effort is normal. No respiratory distress.     Breath sounds: Normal breath sounds. No wheezing.     Comments: Speaking in full sentences.  Clear lung sounds in all fields. Abdominal:     General: There is no distension.  Palpations: Abdomen is soft. There is no mass.     Tenderness: There is no abdominal tenderness. There is no guarding.  Musculoskeletal:        General: Normal range of motion.     Cervical back: Normal range of motion and neck supple.  Skin:    General: Skin is warm and dry.     Capillary Refill: Capillary refill takes less than 2 seconds.  Neurological:     Mental Status: She is alert and oriented to person, place, and time.     GCS: GCS eye subscore is 4. GCS verbal subscore is 5. GCS motor subscore is 6.     Cranial Nerves: Cranial nerves 2-12 are intact.     Sensory: Sensation is intact.     Comments: No focal neurologic deficits  Psychiatric:        Mood and Affect: Mood and  affect normal.        Speech: Speech normal.        Behavior: Behavior normal.    ED Results / Procedures / Treatments   Labs (all labs ordered are listed, but only abnormal results are displayed) Labs Reviewed  COMPREHENSIVE METABOLIC PANEL - Abnormal; Notable for the following components:      Result Value   Calcium 8.8 (*)    AST 12 (*)    All other components within normal limits  RESP PANEL BY RT-PCR (FLU A&B, COVID) ARPGX2  CBC WITH DIFFERENTIAL/PLATELET  PREGNANCY, URINE    EKG None  Radiology No results found.  Procedures Procedures    Medications Ordered in ED Medications  ketorolac (TORADOL) 15 MG/ML injection 15 mg (15 mg Intravenous Given 11/13/21 1359)  metoCLOPramide (REGLAN) injection 10 mg (10 mg Intravenous Given 11/13/21 1359)  diphenhydrAMINE (BENADRYL) capsule 50 mg (50 mg Oral Given 11/13/21 1359)  sodium chloride 0.9 % bolus 1,000 mL (1,000 mLs Intravenous New Bag/Given 11/13/21 1358)    ED Course/ Medical Decision Making/ A&P                           Medical Decision Making Amount and/or Complexity of Data Reviewed Labs: ordered.  Risk Prescription drug management.    This patient presents to the ED for concern of headache and lightheadedness.  This involves an extensive number of treatment options, and is a complaint that carries with it a moderate risk of complications and morbidity.  The differential diagnosis includes headache, migraine, viral illness, anemia, dehydration, sinusitis   Lab Tests:  I ordered, and personally interpreted labs.  The pertinent results include: No leukocytosis.  No anemia.  Electrolytes stable.  COVID and flu negative.  Pregnancy negative   Medicines ordered:  I ordered medication including HA cocktail  for sx controll Reevaluation of the patient after these medicines showed that the patient improved On reevaluation, patient reports pain is completely resolved.  No longer feeling lightheaded.  Tolerating  p.o. without difficulty.   Test Considered:  Patient has no neurologic deficits, doubt ICH, SAH, CVA.  Do not feel patient needs emergent CT or MRI at this time.  Disposition:  After consideration of the diagnostic results and the patients response to treatment, I feel that the patent would benefit from continued symptomatic treatment outpatient.  Discussed continued use of Tylenol or ibuprofen as needed for headache.  Follow-up with PCP as needed.  Discussed importance of hydration and eating regular meals.  At this time, patient appears safe for discharge.  Return precautions given.  Patient states she understands and agrees to plan   Final Clinical Impression(s) / ED Diagnoses Final diagnoses:  None    Rx / DC Orders ED Discharge Orders     None         Franchot Heidelberg, PA-C 11/13/21 1517    Long, Wonda Olds, MD 11/19/21 [phone]

## 2021-11-13 NOTE — Discharge Instructions (Signed)
Your work-up was overall reassuring today. Take Tylenol or ibuprofen as needed for headache. Make sure you are staying well-hydrated with water. Follow-up with your primary care doctor if your headaches return. Return to the emergency room with any new, worsening, concerning symptoms

## 2021-11-13 NOTE — ED Notes (Signed)
ED Provider at bedside. 

## 2022-02-22 ENCOUNTER — Ambulatory Visit (INDEPENDENT_AMBULATORY_CARE_PROVIDER_SITE_OTHER): Payer: Self-pay

## 2022-02-22 ENCOUNTER — Encounter (HOSPITAL_COMMUNITY): Payer: Self-pay | Admitting: Emergency Medicine

## 2022-02-22 ENCOUNTER — Ambulatory Visit (HOSPITAL_COMMUNITY)
Admission: EM | Admit: 2022-02-22 | Discharge: 2022-02-22 | Disposition: A | Discharge status: Home / Self Care | Payer: Self-pay | Attending: Internal Medicine | Admitting: Internal Medicine

## 2022-02-22 DIAGNOSIS — R079 Chest pain, unspecified: Secondary | ICD-10-CM

## 2022-02-22 DIAGNOSIS — M940 Chondrocostal junction syndrome [Tietze]: Secondary | ICD-10-CM

## 2022-02-22 DIAGNOSIS — R490 Dysphonia: Secondary | ICD-10-CM

## 2022-02-22 HISTORY — DX: Anxiety disorder, unspecified: F41.9

## 2022-02-22 LAB — CBC WITH DIFFERENTIAL/PLATELET
Abs Immature Granulocytes: 0.01 10*3/uL (ref 0.00–0.07)
Basophils Absolute: 0 10*3/uL (ref 0.0–0.1)
Basophils Relative: 1 %
Eosinophils Absolute: 0.3 10*3/uL (ref 0.0–0.5)
Eosinophils Relative: 6 %
HCT: 39.7 % (ref 36.0–46.0)
Hemoglobin: 13.1 g/dL (ref 12.0–15.0)
Immature Granulocytes: 0 %
Lymphocytes Relative: 33 %
Lymphs Abs: 1.4 10*3/uL (ref 0.7–4.0)
MCH: 30.8 pg (ref 26.0–34.0)
MCHC: 33 g/dL (ref 30.0–36.0)
MCV: 93.2 fL (ref 80.0–100.0)
Monocytes Absolute: 0.4 10*3/uL (ref 0.1–1.0)
Monocytes Relative: 10 %
Neutro Abs: 2.1 10*3/uL (ref 1.7–7.7)
Neutrophils Relative %: 50 %
Platelets: 250 10*3/uL (ref 150–400)
RBC: 4.26 MIL/uL (ref 3.87–5.11)
RDW: 13 % (ref 11.5–15.5)
WBC: 4.3 10*3/uL (ref 4.0–10.5)
nRBC: 0 % (ref 0.0–0.2)

## 2022-02-22 LAB — COMPREHENSIVE METABOLIC PANEL
ALT: 10 U/L (ref 0–44)
AST: 14 U/L — ABNORMAL LOW (ref 15–41)
Albumin: 4.2 g/dL (ref 3.5–5.0)
Alkaline Phosphatase: 59 U/L (ref 38–126)
Anion gap: 8 (ref 5–15)
BUN: 11 mg/dL (ref 6–20)
CO2: 27 mmol/L (ref 22–32)
Calcium: 9.3 mg/dL (ref 8.9–10.3)
Chloride: 104 mmol/L (ref 98–111)
Creatinine, Ser: 0.7 mg/dL (ref 0.44–1.00)
GFR, Estimated: >60 mL/min (ref >60)
Glucose, Bld: 90 mg/dL (ref 70–99)
Potassium: 3.8 mmol/L (ref 3.5–5.1)
Sodium: 139 mmol/L (ref 135–145)
Total Bilirubin: 0.6 mg/dL (ref 0.3–1.2)
Total Protein: 7.2 g/dL (ref 6.5–8.1)

## 2022-02-22 LAB — TSH: TSH: 0.565 u[IU]/mL (ref 0.350–4.500)

## 2022-02-22 LAB — VITAMIN D 25 HYDROXY (VIT D DEFICIENCY, FRACTURES): Vit D, 25-Hydroxy: 11.05 ng/mL — ABNORMAL LOW (ref 30–100)

## 2022-02-22 MED ORDER — OMEPRAZOLE 20 MG PO CPDR: 20.0000 mg | DELAYED_RELEASE_CAPSULE | Freq: Every day | ORAL | 1 refills | Status: DC

## 2022-02-22 MED ORDER — MELOXICAM 7.5 MG PO TABS: 7.5000 mg | ORAL_TABLET | Freq: Every day | ORAL | 0 refills | Status: DC

## 2022-02-22 NOTE — ED Triage Notes (Signed)
Pt reports that she has dx with anxiety over past year and having chest pains and heaviness in her back. Pt reports she recently moved from [place] and when was seen in [place] was supposed to follow up for a stress test but didn't have one.  ?  Adds that last week started having indigestion and having lots of gas. Tried taking Omeprazole for acid reflux.  ?

## 2022-02-22 NOTE — Discharge Instructions (Addendum)
Heating pad use only 20 minutes on-20 minutes off cycle ?Take take anti-inflammatory agents as needed for pain ?Gentle stretching exercises recommended ?If pain persist please return to urgent care to be reevaluated ?If you continue to gastroesophageal reflux symptoms despite omeprazole, you may need to be evaluated by gastroenterologist ?Return to urgent care if you have any other concerns ?Your chest pain is from your heart ?

## 2022-02-22 NOTE — ED Provider Notes (Addendum)
?[facility] ? ? ? ?CSN: [id number] ?Arrival date & time: 02/22/22  1204 ? ? ?  ? ?History   ?Chief Complaint ?Chief Complaint  ?Patient presents with  ? Back Pain  ? Chest Pain  ? ? ?HPI ?Tiffany Welch is a 36 y.o. female with history of gastroesophageal reflux disease previously on omeprazole comes to the urgent care with complaints of sharp left-sided chest pain worsening over the past couple of days.  Patient also complains of hoarseness of voice over the past couple of days.  Patient has had recurrent chest pain and anxiety over the past year.  She denies any previous cardiac work-up.  Today the patient describes sharp precordial chest pain of moderate severity.  Pain is associated with the patient denies any relieving factors.  She denies any NSAID use.  Patient also describes worsening gastroesophageal reflux symptoms.  She endorses hoarseness of the voice over the past couple of days.  She has had increased belching and some constipation.  No ?.  No fever or chills.  No shortness of breath or wheezing. No formal trauma to the chest.  Abdominal pain is not aggravated by fatty foods. ?HPI ? ?Past Medical History:  ?Diagnosis Date  ? Anxiety   ? ? ?There are no problems to display for this patient. ? ? ?Past Surgical History:  ?Procedure Laterality Date  ? CHOLECYSTECTOMY    ? TUBAL LIGATION    ? ? ?OB History   ?No obstetric history on file. ?  ? ? ? ?Home Medications   ? ?Prior to Admission medications   ?Medication Sig Start Date End Date Taking? Authorizing Provider  ?meloxicam (MOBIC) 7.5 MG tablet Take 1 tablet (7.5 mg total) by mouth daily. 02/22/22  Yes Quintez Maselli, Myrene Galas, MD  ?omeprazole (PRILOSEC) 20 MG capsule Take 1 capsule (20 mg total) by mouth daily. 02/22/22  Yes Charlsey Moragne, Myrene Galas, MD  ?albuterol (VENTOLIN HFA) 108 (90 Base) MCG/ACT inhaler Inhale 1-2 puffs into the lungs every 4 (four) hours as needed for wheezing or shortness of breath. 08/21/21   Melynda Ripple, MD  ?ibuprofen (ADVIL) 600  MG tablet Take 1 tablet (600 mg total) by mouth every 6 (six) hours as needed. 08/21/21   Melynda Ripple, MD  ?Spacer/Aero-Holding Chambers (AEROCHAMBER PLUS) inhaler Use with inhaler 08/21/21   Melynda Ripple, MD  ? ? ?Family History ?Family History  ?Family history unknown: Yes  ? ? ?Social History ?Social History  ? ?Tobacco Use  ? Smoking status: Never  ? Smokeless tobacco: Never  ?Substance Use Topics  ? Alcohol use: Not Currently  ? Drug use: Never  ? ? ? ?Allergies   ?Codeine and Doxycycline ? ? ?Review of Systems ?Review of Systems ?As per HPI ? ?Physical Exam ?Triage Vital Signs ?ED Triage Vitals  ?Enc Vitals Group  ?   BP 02/22/22 1328 105/67  ?   Pulse Rate 02/22/22 1328 64  ?   Resp 02/22/22 1328 17  ?   Temp 02/22/22 1328 97.8 ?F (36.6 ?C)  ?   Temp Source 02/22/22 1328 Oral  ?   SpO2 02/22/22 1328 99 %  ?   Weight --   ?   Height --   ?   Head Circumference --   ?   Peak Flow --   ?   Pain Score 02/22/22 1323 8  ?   Pain Loc --   ?   Pain Edu? --   ?   Excl. in [place]? --   ? ?  No data found. ? ?Updated Vital Signs ?BP 105/67 (BP Location: Left Arm)   Pulse 64   Temp 97.8 ?F (36.6 ?C) (Oral)   Resp 17   LMP 02/15/2022   SpO2 99%  ? ?Visual Acuity ?Right Eye Distance:   ?Left Eye Distance:   ?Bilateral Distance:   ? ?Right Eye Near:   ?Left Eye Near:    ?Bilateral Near:    ? ?Physical Exam ?Vitals and nursing note reviewed.  ?Cardiovascular:  ?   Rate and Rhythm: Normal rate and regular rhythm.  ?   Heart sounds: Normal heart sounds.  ?Pulmonary:  ?   Effort: Pulmonary effort is normal.  ?   Breath sounds: No decreased breath sounds, wheezing, rhonchi or rales.  ?Chest:  ?   Chest wall: Tenderness present.  ?   Comments: Tenderness on palpation over the left costochondral joints. ?Abdominal:  ?   General: Bowel sounds are normal. There is no abdominal bruit.  ?   Palpations: There is no hepatomegaly, splenomegaly or mass.  ?   Tenderness: There is no abdominal tenderness. There is no guarding.   ?Neurological:  ?   Mental Status: She is alert.  ? ? ? ?UC Treatments / Results  ?Labs ?(all labs ordered are listed, but only abnormal results are displayed) ?Labs Reviewed  ?CBC WITH DIFFERENTIAL/PLATELET  ?COMPREHENSIVE METABOLIC PANEL  ?TSH  ?VITAMIN D 25 HYDROXY (VIT D DEFICIENCY, FRACTURES)  ? ? ?EKG ? ? ?Radiology ?DG Chest 2 View ? ?Result Date: 02/22/2022 ?CLINICAL DATA:  Chest pain EXAM: CHEST - 2 VIEW COMPARISON:  None Available. FINDINGS: Normal heart size. Normal mediastinal contour. No pneumothorax. No pleural effusion. Lungs appear clear, with no acute consolidative airspace disease and no pulmonary edema. Cholecystectomy clips are seen in the right upper quadrant of the abdomen. IMPRESSION: No active cardiopulmonary disease. Electronically Signed   By: Ilona Sorrel M.D.   On: 02/22/2022 13:53   ? ?Procedures ?Procedures (including critical care time) ? ?Medications Ordered in UC ?Medications - No data to display ? ?Initial Impression / Assessment and Plan / UC Course  ?I have reviewed the triage vital signs and the nursing notes. ? ?Pertinent labs & imaging results that were available during my care of the patient were reviewed by me and considered in my medical decision making (see chart for details). ? ?  ?. ?1.Costochondritis: ?Gentle stretching exercises ?Heating pad use only 20 minutes on-20 mg ?Meloxicam 7.5 mg orally daily ?EKG showed normal sinus rhythm with no acute ST/T wave changes ?Return to urgent care if symptoms worsen ? ?2.  Hoarseness of voice likely secondary to gastroesophageal reflux disease: ?Omeprazole 20 mg orally daily ?Gastroenterology evaluation if patient continues to have symptoms in spite of being on omeprazole for a month. ?Return to urgent care if symptoms worsen. ? ?3.  Dizziness with generalized weakness: ?CBC, CMP, TSH, vitamin D level ?We will call patient with recommendations if labs are abnormal. ? ? ?Final Clinical Impressions(s) / UC Diagnoses  ? ?Final  diagnoses:  ?Costochondritis  ?Hoarseness of voice  ? ? ? ?Discharge Instructions   ? ?  ?Heating pad use only 20 minutes on-20 minutes off cycle ?Take take anti-inflammatory agents as needed for pain ?Gentle stretching exercises recommended ?If pain persist please return to urgent care to be reevaluated ?If you continue to gastroesophageal reflux symptoms despite omeprazole, you may need to be evaluated by gastroenterologist ?Return to urgent care if you have any other concerns ?Your chest pain is  from your heart ? ? ?ED Prescriptions   ? ? Medication Sig Dispense Auth. Provider  ? omeprazole (PRILOSEC) 20 MG capsule Take 1 capsule (20 mg total) by mouth daily. 30 capsule Ivar Domangue, Myrene Galas, MD  ? meloxicam (MOBIC) 7.5 MG tablet Take 1 tablet (7.5 mg total) by mouth daily. 20 tablet Zyia Kaneko, Myrene Galas, MD  ? ?  ? ?PDMP not reviewed this encounter. ?  ?Chase Picket, MD ?02/22/22 1448 ? ?  ?Chase Picket, MD ?02/22/22 1450 ? ?

## 2022-02-23 ENCOUNTER — Telehealth (HOSPITAL_COMMUNITY): Payer: Self-pay | Admitting: Emergency Medicine

## 2022-02-23 MED ORDER — VITAMIN D (ERGOCALCIFEROL) 1.25 MG (50000 UNIT) PO CAPS: 50000.0000 [IU] | ORAL_CAPSULE | ORAL | 0 refills | Status: AC

## 2022-06-01 ENCOUNTER — Encounter (HOSPITAL_BASED_OUTPATIENT_CLINIC_OR_DEPARTMENT_OTHER): Payer: Self-pay | Admitting: Nurse Practitioner

## 2022-06-01 ENCOUNTER — Ambulatory Visit (INDEPENDENT_AMBULATORY_CARE_PROVIDER_SITE_OTHER): Payer: Commercial Managed Care - HMO | Admitting: Nurse Practitioner

## 2022-06-01 VITALS — BP 114/70 | HR 69 | Ht 65.0 in | Wt 181.0 lb

## 2022-06-01 DIAGNOSIS — F410 Panic disorder [episodic paroxysmal anxiety] without agoraphobia: Secondary | ICD-10-CM

## 2022-06-01 DIAGNOSIS — Z0000 Encounter for general adult medical examination without abnormal findings: Secondary | ICD-10-CM | POA: Insufficient documentation

## 2022-06-01 DIAGNOSIS — F411 Generalized anxiety disorder: Secondary | ICD-10-CM

## 2022-06-01 HISTORY — DX: Encounter for general adult medical examination without abnormal findings: Z00.00

## 2022-06-01 MED ORDER — VENLAFAXINE HCL ER 37.5 MG PO CP24: ORAL_CAPSULE | ORAL | 0 refills | Status: DC

## 2022-06-01 MED ORDER — ALPRAZOLAM 0.25 MG PO TABS: ORAL_TABLET | ORAL | 0 refills | Status: DC

## 2022-06-01 NOTE — Assessment & Plan Note (Addendum)
>>  ASSESSMENT AND PLAN FOR PANIC DISORDER WRITTEN ON 06/01/2022  8:38 PM BY Jhana Giarratano E, NP  Symptoms and presentation consistent with panic disorder.  She is experiencing intermittent panic attacks related to her underlying anxiety.  She does have excellent skills in relaxation and deep breathing exercises which have been very beneficial for her.  Given the significance of her symptoms we did discuss the option of very low-dose benzodiazepine use for episodes of panic that are not controlled with her typical natural relaxation methods.  She will trial this to see if it is helpful.  We discussed the importance of not taking the medication on a daily basis to help prevent the risk of dependency.  Patient expressed understanding.  We will plan to follow-up in approximately 3 weeks with a phone call.  She does work during the day therefore we will plan to make the call late in the evening after normal hours.  Okay to schedule the visit during the day and we will plan for the call in the afternoon.  >>ASSESSMENT AND PLAN FOR GENERALIZED ANXIETY DISORDER WRITTEN ON 06/01/2022  8:41 PM BY Lariza Cothron E, NP  Symptoms and presentation consistent with generalized anxiety disorder with intermittent panic symptoms.  Patient has been very well controlled recently without any medication however she has had some significant life events which naturally can exacerbate this issue.  She has had exacerbations to the point of requiring hospitalization in the past, however at this time there are no indications for symptoms severe enough to require that.  Patient praised for seeking care at this time.  Given that she has had such good success with venlafaxine in the past we will plan to restart that medication.  We will plan for 37.5 mg for at least 1 week and then increase to 75 mg.  If she is tolerating this dose and symptoms are improved we can continue, otherwise we will plan to increase slowly as needed.  We will plan for  follow-up with phone call in approximately 3 weeks to evaluate how she is doing.  Patient will need a call after hours as she does work during the day and I do not want her to have to take off of work for this check in.  Okay to schedule this way.  She will let me know if she has any new or worsening symptoms in the meantime.

## 2022-06-01 NOTE — Assessment & Plan Note (Signed)
Basic labs for evaluation today and monitoring

## 2022-06-01 NOTE — Patient Instructions (Signed)
Thank you for choosing [place] at [facility] for your Primary Care needs. I am excited for the opportunity to partner with you to meet your health care goals. It was a pleasure meeting you today!  Recommendations from today's visit: I will be in touch with you when you get your labs back I will plan to call you in about 3 weeks to check on the medication  Information on diet, exercise, and health maintenance recommendations are listed below. This is information to help you be sure you are on track for optimal health and monitoring.   Please look over this and let us know if you have any questions or if you have completed any of the health maintenance outside of [place] so that we can be sure your records are up to date.  ___________________________________________________________ About Me: I am an Adult-Geriatric Nurse Practitioner with a background in caring for patients for more than 20 years with a strong intensive care background. I provide primary care and sports medicine services to patients age 74 and older within this office. My education had a strong focus on caring for the older adult population, which I am passionate about. I am also the director of the APP Fellowship with [facility].   My desire is to provide you with the best service through preventive medicine and supportive care. I consider you a part of the medical team and value your input. I work diligently to ensure that you are heard and your needs are met in a safe and effective manner. I want you to feel comfortable with me as your provider and want you to know that your health concerns are important to me.  For your information, our office hours are: Monday, Tuesday, and Thursday 8:00 AM - 5:00 PM Wednesday and Friday 8:00 AM - 12:00 PM.   In my time away from the office I am teaching new APP's within the system and am unavailable, but my partner, Dr. Burnard Bunting is in the office for emergent needs.    If you have questions or concerns, please call our office at [phone] or send [place] a MyChart message and we will respond as quickly as possible.  ____________________________________________________________ MyChart:  For all urgent or time sensitive needs we ask that you please call the office to avoid delays. Our number is [phone]. MyChart is not constantly monitored and due to the large volume of messages a day, replies may take up to 72 business hours.  MyChart Policy: MyChart allows for you to see your visit notes, after visit summary, provider recommendations, lab and tests results, make an appointment, request refills, and contact your provider or the office for non-urgent questions or concerns. Providers are seeing patients during normal business hours and do not have built in time to review MyChart messages.  We ask that you allow a minimum of 3 business days for responses to [employer]. For this reason, please do not send urgent requests through [place]. Please call the office at [phone]. New and ongoing conditions may require a visit. We have virtual and in person visit available for your convenience.  Complex MyChart concerns may require a visit. Your provider may request you schedule a virtual or in person visit to ensure we are providing the best care possible. MyChart messages sent after 11:00 AM on Friday will not be received by the provider until Monday morning.    Lab and Test Results: You will receive your lab and test results on  MyChart as soon as they are completed and results have been sent by the lab or testing facility. Due to this service, you will receive your results BEFORE your provider.  I review lab and tests results each morning prior to seeing patients. Some results require collaboration with other providers to ensure you are receiving the most appropriate care. For this reason, we ask that you please allow a minimum of 3-5 business days from the  time the ALL results have been received for your provider to receive and review lab and test results and contact you about these.  Most lab and test result comments from the provider will be sent through [place]. Your provider may recommend changes to the plan of care, follow-up visits, repeat testing, ask questions, or request an office visit to discuss these results. You may reply directly to this message or call the office at [phone] to provide information for the provider or set up an appointment. In some instances, you will be called with test results and recommendations. Please let us know if this is preferred and we will make note of this in your chart to provide this for you.    If you have not heard a response to your lab or test results in 5 business days from all results returning to [place], please call the office to let us know. We ask that you please avoid calling prior to this time unless there is an emergent concern. Due to high call volumes, this can delay the resulting process.  After Hours: For all non-emergency after hours needs, please call the office at [phone] and select the option to reach the on-call provider service. On-call services are shared between multiple [place] offices and therefore it will not be possible to speak directly with your provider. On-call providers may provide medical advice and recommendations, but are unable to provide refills for maintenance medications.  For all emergency or urgent medical needs after normal business hours, we recommend that you seek care at the closest Urgent Care or Emergency Department to ensure appropriate treatment in a timely manner.  [facility] [place] has a 24 hour emergency room located on the ground floor for your convenience.   Urgent Concerns During the Business Day Providers are seeing patients from 8AM to [place] with a busy schedule and are most often not able to respond to non-urgent calls until  the end of the day or the next business day. If you should have URGENT concerns during the day, please call and speak to the nurse or schedule a same day appointment so that we can address your concern without delay.   Thank you, again, for choosing me as your health care partner. I appreciate your trust and look forward to learning more about you.   Tiffany Keeler, DNP, AGNP-c ___________________________________________________________  Health Maintenance Recommendations Screening Testing Mammogram Every 1 -2 years based on history and risk factors Starting at age 4 Pap Smear Ages 21-39 every 3 years Ages 104-65 every 5 years with HPV testing More frequent testing may be required based on results and history Colon Cancer Screening Every 1-10 years based on test performed, risk factors, and history Starting at age 5 Bone Density Screening Every 2-10 years based on history Starting at age 25 for women Recommendations for men differ based on medication usage, history, and risk factors AAA Screening One time ultrasound Men 36-50 years old who have every smoked Lung Cancer Screening Low Dose Lung CT every 12 months Age  55-80 years with a 30 pack-year smoking history who still smoke or who have quit within the last 15 years  Screening Labs Routine  Labs: Complete Blood Count (CBC), Complete Metabolic Panel (CMP), Cholesterol (Lipid Panel) Every 6-12 months based on history and medications May be recommended more frequently based on current conditions or previous results Hemoglobin A1c Lab Every 3-12 months based on history and previous results Starting at age 39 or earlier with diagnosis of diabetes, high cholesterol, BMI >26, and/or risk factors Frequent monitoring for patients with diabetes to ensure blood sugar control Thyroid Panel (TSH w/ T3 & T4) Every 6 months based on history, symptoms, and risk factors May be repeated more often if on medication HIV One time testing  for all patients 50 and older May be repeated more frequently for patients with increased risk factors or exposure Hepatitis C One time testing for all patients 64 and older May be repeated more frequently for patients with increased risk factors or exposure Gonorrhea, Chlamydia Every 12 months for all sexually active persons 13-24 years Additional monitoring may be recommended for those who are considered high risk or who have symptoms PSA Men 4-7 years old with risk factors Additional screening may be recommended from age 44-69 based on risk factors, symptoms, and history  Vaccine Recommendations Tetanus Booster All adults every 10 years Flu Vaccine All patients 6 months and older every year COVID Vaccine All patients 12 years and older Initial dosing with booster May recommend additional booster based on age and health history HPV Vaccine 2 doses all patients age 51-26 Dosing may be considered for patients over 26 Shingles Vaccine (Shingrix) 2 doses all adults 62 years and older Pneumonia (Pneumovax 23) All adults 70 years and older May recommend earlier dosing based on health history Pneumonia (Prevnar 24) All adults 64 years and older Dosed 1 year after Pneumovax 23  Additional Screening, Testing, and Vaccinations may be recommended on an individualized basis based on family history, health history, risk factors, and/or exposure.  __________________________________________________________  Diet Recommendations for All Patients  I recommend that all patients maintain a diet low in saturated fats, carbohydrates, and cholesterol. While this can be challenging at first, it is not impossible and small changes can make big differences.  Things to try: Decreasing the amount of soda, sweet tea, and/or juice to one or less per day and replace with water While water is always the first choice, if you do not like water you may consider adding a water additive without sugar to  improve the taste other sugar free drinks Replace potatoes with a brightly colored vegetable at dinner Use healthy oils, such as canola oil or olive oil, instead of butter or hard margarine Limit your bread intake to two pieces or less a day Replace regular pasta with low carb pasta options Bake, broil, or grill foods instead of frying Monitor portion sizes  Eat smaller, more frequent meals throughout the day instead of large meals  An important thing to remember is, if you love foods that are not great for your health, you don't have to give them up completely. Instead, allow these foods to be a reward when you have done well. Allowing yourself to still have special treats every once in a while is a nice way to tell yourself thank you for working hard to keep yourself healthy.   Also remember that every day is a new day. If you have a bad day and "fall off the wagon", you can  still climb right back up and keep moving along on your journey!  We have resources available to help you!  Some websites that may be helpful include: www.http://carter.biz/  Www.VeryWellFit.com _____________________________________________________________  Activity Recommendations for All Patients  I recommend that all adults get at least 20 minutes of moderate physical activity that elevates your heart rate at least 5 days out of the week.  Some examples include: Walking or jogging at a pace that allows you to carry on a conversation Cycling (stationary bike or outdoors) Water aerobics Yoga Weight lifting Dancing If physical limitations prevent you from putting stress on your joints, exercise in a pool or seated in a chair are excellent options.  Do determine your MAXIMUM heart rate for activity: YOUR AGE - 220 = MAX HeartRate   Remember! Do not push yourself too hard.  Start slowly and build up your pace, speed, weight, time in exercise, etc.  Allow your body to rest between exercise and get good sleep. You  will need more water than normal when you are exerting yourself. Do not wait until you are thirsty to drink. Drink with a purpose of getting in at least 8, 8 ounce glasses of water a day plus more depending on how much you exercise and sweat.    If you begin to develop dizziness, chest pain, abdominal pain, jaw pain, shortness of breath, headache, vision changes, lightheadedness, or other concerning symptoms, stop the activity and allow your body to rest. If your symptoms are severe, seek emergency evaluation immediately. If your symptoms are concerning, but not severe, please let us know so that we can recommend further evaluation.

## 2022-06-01 NOTE — Assessment & Plan Note (Signed)
Symptoms and presentation consistent with generalized anxiety disorder with intermittent panic symptoms.  Patient has been very well controlled recently without any medication however she has had some significant life events which naturally can exacerbate this issue.  She has had exacerbations to the point of requiring hospitalization in the past, however at this time there are no indications for symptoms severe enough to require that.  Patient praised for seeking care at this time.  Given that she has had such good success with venlafaxine in the past we will plan to restart that medication.  We will plan for 37.5 mg for at least 1 week and then increase to 75 mg.  If she is tolerating this dose and symptoms are improved we can continue, otherwise we will plan to increase slowly as needed.  We will plan for follow-up with phone call in approximately 3 weeks to evaluate how she is doing.  Patient will need a call after hours as she does work during the day and I do not want her to have to take off of work for this check in.  Okay to schedule this way.  She will let me know if she has any new or worsening symptoms in the meantime.

## 2022-06-01 NOTE — Progress Notes (Signed)
Tollie Eth, DNP, AGNP-c Primary Care & Sports Medicine [address]  [address] [place], [place] [address] [phone]  New patient visit   Patient: Tiffany Welch   DOB: May 12, 1986   36 y.o. Female  MRN: [id number] Visit Date: 06/01/2022  Patient Care Team: Tollie Eth, NP as PCP - General (Nurse Practitioner)  Today's Vitals   06/01/22 1530  BP: 114/70  Pulse: 69  SpO2: 98%  Weight: 181 lb (82.1 kg)  Height: 5\' 5"  (1.651 m)   Body mass index is 30.12 kg/m.   Today's healthcare provider: , NP   Chief Complaint  Patient presents with   New Patient (Initial Visit)    Patient presents today to establish care. 2019 mental break down. She is new to [facility] and her anxiety is back.   Subjective    Keani Gotcher is a 36 y.o. female who presents today as a new patient to establish care.    Patient endorses the following concerns presently: 30 tells me that she has recently moved to [place] from [place] with 3 of her 4 children.  Her oldest daughter is 16 years old and will be starting college this fall and is currently residing with her parents in [place] near her college campus.  She reports that thus far the 4 children that have moved with her are doing very well and have adapted to the new environment without any issues.  She tells me that she does not have any family locally however she does have a very good support system with her church family and she does have a boyfriend who has family nearby and they treat her very well and welcome her.  She reports that she does have a past medical history of anxiety which has been exacerbated to the point of requiring hospitalization twice in the past.  She tells me in 2019 she did experience a "mental breakdown" in which that she was hospitalized for approximately 2 weeks.  She does not have recollection of that timeframe.  She tells me that after 2 weeks of hospitalization she did  have improvement of her symptoms and was released but was subsequently readmitted shortly thereafter for another week.  Following that admission she tells me that she has done quite well and her mood has been very stable.  She reports that she did eventually wean herself off of the medication she was placed on because she was having some concerns with being on medication long-term and she was not sure if she still needed it.  She tells me that she did not have any issues come off of the medication and she has been doing quite well mentally until the last several months.  She reports that she has been experiencing increased stress and anxiety.  Last fall she did lose her job however she quickly found a new job but this transition was still stressful for her.  She also reports that she does often feel stress related to general everyday life including the cost of living, raising her kids, keeping her job, etc.  She tells me that she is not having any symptoms of suicide or self-harm at this time and she does not feel that she is currently on the verge of a mental breakdown but she would like to discuss treatment options before she gets to that point.  She tells me that she is done a very good job to hold herself together for her children and this  is her primary focus at this time.  She does tell me that she is very strong in her faith and works as a Education officer, environmental which has been significantly beneficial for her management.  She relies greatly on her trust and faith in God to help her through this.  She she reports intermittent symptoms of palpitations, and tightness in her chest associated with anxiety.  She reports when these incidents occur she is occasionally able to calm herself down.  Deep breathing exercises however there are times when the symptoms are too great for her to use control.  She has been on benzodiazepines in the past to help control her anxiety symptoms and very sleepy so she is hesitant to consider  this again however she feels that the dosage was fairly high.  Her daily medication previously was venlafaxine and she tells me that she tolerated it well without any side effects.   History reviewed and reveals the following: Past Medical History:  Diagnosis Date   Anxiety    Past Surgical History:  Procedure Laterality Date   CHOLECYSTECTOMY     TUBAL LIGATION     No family status information on file.   Family History  Family history unknown: Yes   Social History   Socioeconomic History   Marital status: Single    Spouse name: Not on file   Number of children: Not on file   Years of education: Not on file   Highest education level: Not on file  Occupational History   Not on file  Tobacco Use   Smoking status: Never   Smokeless tobacco: Never  Substance and Sexual Activity   Alcohol use: Not Currently   Drug use: Never   Sexual activity: Yes  Other Topics Concern   Not on file  Social History Narrative   Not on file   Social Determinants of Health   Financial Resource Strain: Not on file  Food Insecurity: Not on file  Transportation Needs: Not on file  Physical Activity: Not on file  Stress: Not on file  Social Connections: Not on file   Outpatient Medications Prior to Visit  Medication Sig   omeprazole (PRILOSEC) 20 MG capsule Take 1 capsule (20 mg total) by mouth daily.   [DISCONTINUED] albuterol (VENTOLIN HFA) 108 (90 Base) MCG/ACT inhaler Inhale 1-2 puffs into the lungs every 4 (four) hours as needed for wheezing or shortness of breath. (Patient not taking: Reported on 06/01/2022)   [DISCONTINUED] ibuprofen (ADVIL) 600 MG tablet Take 1 tablet (600 mg total) by mouth every 6 (six) hours as needed. (Patient not taking: Reported on 06/01/2022)   [DISCONTINUED] meloxicam (MOBIC) 7.5 MG tablet Take 1 tablet (7.5 mg total) by mouth daily. (Patient not taking: Reported on 06/01/2022)   [DISCONTINUED] Spacer/Aero-Holding Chambers (AEROCHAMBER PLUS) inhaler Use with  inhaler (Patient not taking: Reported on 06/01/2022)   No facility-administered medications prior to visit.   Allergies  Allergen Reactions   Codeine    Doxycycline     There is no immunization history on file for this patient.  Review of Systems All review of systems negative except what is listed in the HPI   Objective    BP 114/70   Pulse 69   Ht 5\' 5"  (1.651 m)   Wt 181 lb (82.1 kg)   SpO2 98%   BMI 30.12 kg/m  Physical Exam Vitals and nursing note reviewed.  Constitutional:      General: She is not in acute distress.    Appearance:  Normal appearance.  Eyes:     Extraocular Movements: Extraocular movements intact.     Conjunctiva/sclera: Conjunctivae normal.     Pupils: Pupils are equal, round, and reactive to light.  Neck:     Vascular: No carotid bruit.  Cardiovascular:     Rate and Rhythm: Normal rate and regular rhythm.     Pulses: Normal pulses.     Heart sounds: Normal heart sounds. No murmur heard. Pulmonary:     Effort: Pulmonary effort is normal.     Breath sounds: Normal breath sounds. No wheezing.  Musculoskeletal:        General: Normal range of motion.     Cervical back: Normal range of motion.     Right lower leg: No edema.     Left lower leg: No edema.  Skin:    General: Skin is warm and dry.     Capillary Refill: Capillary refill takes less than 2 seconds.  Neurological:     General: No focal deficit present.     Mental Status: She is alert and oriented to person, place, and time.  Psychiatric:        Mood and Affect: Mood normal. Affect is tearful.        Behavior: Behavior normal.        Thought Content: Thought content normal.        Judgment: Judgment normal.     No results found for any visits on 06/01/22.  Assessment & Plan      Problem List Items Addressed This Visit     Wellness examination    Basic labs for evaluation today and monitoring      Relevant Orders   B12 and Folate Panel   CBC With Diff/Platelet    Comprehensive metabolic panel   Hemoglobin A1c   Iron, TIBC and Ferritin Panel   VITAMIN D 25 Hydroxy (Vit-D Deficiency, Fractures)   Thyroid Panel With TSH   Lipid panel   Panic disorder    Symptoms and presentation consistent with panic disorder.  She is experiencing intermittent panic attacks related to her underlying anxiety.  She does have excellent skills in relaxation and deep breathing exercises which have been very beneficial for her.  Given the significance of her symptoms we did discuss the option of very low-dose benzodiazepine use for episodes of panic that are not controlled with her typical natural relaxation methods.  She will trial this to see if it is helpful.  We discussed the importance of not taking the medication on a daily basis to help prevent the risk of dependency.  Patient expressed understanding.  We will plan to follow-up in approximately 3 weeks with a phone call.  She does work during the day therefore we will plan to make the call late in the evening after normal hours.  Okay to schedule the visit during the day and we will plan for the call in the afternoon.      Relevant Medications   venlafaxine XR (EFFEXOR XR) 37.5 MG 24 hr capsule   ALPRAZolam (XANAX) 0.25 MG tablet   Generalized anxiety disorder - Primary    Symptoms and presentation consistent with generalized anxiety disorder with intermittent panic symptoms.  Patient has been very well controlled recently without any medication however she has had some significant life events which naturally can exacerbate this issue.  She has had exacerbations to the point of requiring hospitalization in the past, however at this time there are no indications for symptoms severe  enough to require that.  Patient praised for seeking care at this time.  Given that she has had such good success with venlafaxine in the past we will plan to restart that medication.  We will plan for 37.5 mg for at least 1 week and then increase to 75  mg.  If she is tolerating this dose and symptoms are improved we can continue, otherwise we will plan to increase slowly as needed.  We will plan for follow-up with phone call in approximately 3 weeks to evaluate how she is doing.  Patient will need a call after hours as she does work during the day and I do not want her to have to take off of work for this check in.  Okay to schedule this way.  She will let me know if she has any new or worsening symptoms in the meantime.      Relevant Medications   venlafaxine XR (EFFEXOR XR) 37.5 MG 24 hr capsule   ALPRAZolam (XANAX) 0.25 MG tablet     Return in about 3 weeks (around 06/22/2022) for phone call to check on new medication. - patient will need after hours- OK to do this.      Ksean Vale, Sung Amabile, NP, DNP, AGNP-C Primary Care & Sports Medicine at [facility]

## 2022-06-03 ENCOUNTER — Telehealth (HOSPITAL_BASED_OUTPATIENT_CLINIC_OR_DEPARTMENT_OTHER): Payer: Self-pay | Admitting: Nurse Practitioner

## 2022-06-03 ENCOUNTER — Other Ambulatory Visit (HOSPITAL_BASED_OUTPATIENT_CLINIC_OR_DEPARTMENT_OTHER): Payer: Self-pay

## 2022-06-03 ENCOUNTER — Telehealth (HOSPITAL_BASED_OUTPATIENT_CLINIC_OR_DEPARTMENT_OTHER): Payer: Self-pay

## 2022-06-03 DIAGNOSIS — Z0000 Encounter for general adult medical examination without abnormal findings: Secondary | ICD-10-CM

## 2022-06-03 LAB — COMPREHENSIVE METABOLIC PANEL
ALT: 6 IU/L (ref 0–32)
AST: 12 IU/L (ref 0–40)
Albumin/Globulin Ratio: 1.9 (ref 1.2–2.2)
Albumin: 4.8 g/dL (ref 3.9–4.9)
Alkaline Phosphatase: 69 IU/L (ref 44–121)
BUN/Creatinine Ratio: 13 (ref 9–23)
BUN: 10 mg/dL (ref 6–20)
Bilirubin Total: 0.4 mg/dL (ref 0.0–1.2)
CO2: 23 mmol/L (ref 20–29)
Calcium: 9.8 mg/dL (ref 8.7–10.2)
Chloride: 100 mmol/L (ref 96–106)
Creatinine, Ser: 0.76 mg/dL (ref 0.57–1.00)
Globulin, Total: 2.5 g/dL (ref 1.5–4.5)
Glucose: 75 mg/dL (ref 70–99)
Potassium: 7.3 mmol/L (ref 3.5–5.2)
Sodium: 138 mmol/L (ref 134–144)
Total Protein: 7.3 g/dL (ref 6.0–8.5)
eGFR: 103 mL/min/{1.73_m2} (ref >59)

## 2022-06-03 LAB — THYROID PANEL WITH TSH
Free Thyroxine Index: 2.3 (ref 1.2–4.9)
T3 Uptake Ratio: 30 % (ref 24–39)
T4, Total: 7.8 ug/dL (ref 4.5–12.0)
TSH: 0.441 u[IU]/mL — ABNORMAL LOW (ref 0.450–4.500)

## 2022-06-03 LAB — CBC WITH DIFF/PLATELET
Basophils Absolute: 0 10*3/uL (ref 0.0–0.2)
Basos: 1 %
EOS (ABSOLUTE): 0.2 10*3/uL (ref 0.0–0.4)
Eos: 4 %
Hematocrit: 39.9 % (ref 34.0–46.6)
Hemoglobin: 12.8 g/dL (ref 11.1–15.9)
Immature Grans (Abs): 0 10*3/uL (ref 0.0–0.1)
Immature Granulocytes: 0 %
Lymphocytes Absolute: 1.9 10*3/uL (ref 0.7–3.1)
Lymphs: 35 %
MCH: 29.8 pg (ref 26.6–33.0)
MCHC: 32.1 g/dL (ref 31.5–35.7)
MCV: 93 fL (ref 79–97)
Monocytes Absolute: 0.4 10*3/uL (ref 0.1–0.9)
Monocytes: 7 %
Neutrophils Absolute: 2.8 10*3/uL (ref 1.4–7.0)
Neutrophils: 53 %
Platelets: 254 10*3/uL (ref 150–450)
RBC: 4.29 x10E6/uL (ref 3.77–5.28)
RDW: 12.6 % (ref 11.7–15.4)
WBC: 5.2 10*3/uL (ref 3.4–10.8)

## 2022-06-03 LAB — B12 AND FOLATE PANEL
Folate: 7.8 ng/mL (ref >3.0)
Vitamin B-12: 436 pg/mL (ref 232–1245)

## 2022-06-03 LAB — IRON,TIBC AND FERRITIN PANEL
Ferritin: 65 ng/mL (ref 15–150)
Iron Saturation: 23 % (ref 15–55)
Iron: 69 ug/dL (ref 27–159)
Total Iron Binding Capacity: 298 ug/dL (ref 250–450)
UIBC: 229 ug/dL (ref 131–425)

## 2022-06-03 LAB — LIPID PANEL
Chol/HDL Ratio: 3.9 ratio (ref 0.0–4.4)
Cholesterol, Total: 137 mg/dL (ref 100–199)
HDL: 35 mg/dL — ABNORMAL LOW (ref >39)
LDL Chol Calc (NIH): 89 mg/dL (ref 0–99)
Triglycerides: 60 mg/dL (ref 0–149)
VLDL Cholesterol Cal: 13 mg/dL (ref 5–40)

## 2022-06-03 LAB — HEMOGLOBIN A1C
Est. average glucose Bld gHb Est-mCnc: 111 mg/dL
Hgb A1c MFr Bld: 5.5 % (ref 4.8–5.6)

## 2022-06-03 LAB — VITAMIN D 25 HYDROXY (VIT D DEFICIENCY, FRACTURES): Vit D, 25-Hydroxy: 18.3 ng/mL — ABNORMAL LOW (ref 30.0–100.0)

## 2022-06-03 LAB — SPECIMEN STATUS REPORT

## 2022-06-03 NOTE — Telephone Encounter (Signed)
Received fax from Afterhours nurse from [facility] for critical lab  [employer]) [phone] " Value: Potassium 7.3 Range: 3.5-5.2 Date/Time: 06/02/22 @ 1200 Ordering Provider Huntley Dec Early Last Results: N/A  Please advise.

## 2022-06-03 NOTE — Telephone Encounter (Signed)
Spoke with patient regarding her critical labs, per Minna Merritts is to come in for a potassium recheck.

## 2022-06-17 ENCOUNTER — Telehealth (HOSPITAL_BASED_OUTPATIENT_CLINIC_OR_DEPARTMENT_OTHER): Payer: Self-pay | Admitting: Nurse Practitioner

## 2022-06-17 NOTE — Telephone Encounter (Signed)
Pt called and stated that pts mediation for let her know that pt is needing advisements on this. Put it in the notes for the apppt on 9/5.  venlafaxine XR (EFFEXOR XR) 37.5 MG 24 hr capsule [[id number]]  Please advise.

## 2022-06-22 ENCOUNTER — Encounter (HOSPITAL_BASED_OUTPATIENT_CLINIC_OR_DEPARTMENT_OTHER): Payer: Self-pay | Admitting: Nurse Practitioner

## 2022-06-22 ENCOUNTER — Ambulatory Visit (INDEPENDENT_AMBULATORY_CARE_PROVIDER_SITE_OTHER): Payer: Commercial Managed Care - HMO | Admitting: Nurse Practitioner

## 2022-06-22 DIAGNOSIS — F411 Generalized anxiety disorder: Secondary | ICD-10-CM | POA: Diagnosis not present

## 2022-06-22 DIAGNOSIS — J301 Allergic rhinitis due to pollen: Secondary | ICD-10-CM | POA: Diagnosis not present

## 2022-06-22 DIAGNOSIS — F410 Panic disorder [episodic paroxysmal anxiety] without agoraphobia: Secondary | ICD-10-CM

## 2022-06-22 DIAGNOSIS — E559 Vitamin D deficiency, unspecified: Secondary | ICD-10-CM | POA: Diagnosis not present

## 2022-06-22 HISTORY — DX: Allergic rhinitis due to pollen: J30.1

## 2022-06-22 MED ORDER — VITAMIN D (ERGOCALCIFEROL) 1.25 MG (50000 UNIT) PO CAPS: 50000.0000 [IU] | ORAL_CAPSULE | ORAL | 3 refills | Status: DC

## 2022-06-22 MED ORDER — SERTRALINE HCL 50 MG PO TABS: ORAL_TABLET | ORAL | 1 refills | Status: DC

## 2022-06-22 MED ORDER — LEVOCETIRIZINE DIHYDROCHLORIDE 5 MG PO TABS: 5.0000 mg | ORAL_TABLET | Freq: Every evening | ORAL | 3 refills | Status: DC

## 2022-06-22 NOTE — Progress Notes (Signed)
Virtual Visit Encounter telephone visit.   I connected with  Tiffany Welch on 06/22/22 at  4:50 PM EDT by secure audio telemedicine application. I verified that I am speaking with the correct person using two identifiers.   I introduced myself as a Publishing rights manager with the practice. The limitations of evaluation and management by telemedicine discussed with the patient and the availability of in person appointments. The patient expressed verbal understanding and consent to proceed.  Participating parties in this visit include: Myself and patient  The patient is: Patient Location: Home I am: Provider Location: Office/Clinic Subjective:    CC and HPI: Tiffany Welch is a 36 y.o. year old female presenting for follow up of anxiety. Patient reports the following: Anxiety - she tells me that since our last visit she has been having a really hard time - she tells me after her first dose of venlafaxine she started having palpitations that woke her up from her sleep - she did call 911 and started to hyperventilate- she tells me the paramedics stayed with her until she calmed down- she did take 1/2 xanax and she did feel this was helpful to calm her - she took a total of 4 doses of the venlafaxine, but stopped due to the side effects - she was on this medication previously and tolerated it well, but at this time she was unable to tolerate it - she tells me that her job is extremely stressful and 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 - she tells me that there are times she just wants to leave her job because of the anxiety and stress she is experiencing - she has been thinking about getting a pet for support and would like to know if a letter can be written to request this for work and her apartment - she tells me she is very concerned about the level of anxiety she is experiencing and the possibility of returning to the hospital as she has in the past - she feels that her job  is the primary cause of her anxiety, but there are also other factors that contribute - she has discussed disability with her parents, but is not sure how she would go about applying - she does not feel she has enough time accumulated at her job for [employer] at this time as this is a new position - she is interested in restarting medications, but is not sure which would be good for her  Allergy - popping in throat and ears started a few weeks ago suddenly - she has taken claritin and this helped somewhat but she is still very hoarse and feels congested daily - she has never had allergy symptoms in the past and is unsure if this is what she is experiencing -this is her first fall in [place]  Past medical history, Surgical history, Family history not pertinant except as noted below, Social history, Allergies, and medications have been entered into the medical record, reviewed, and corrections made.   Review of Systems:  All review of systems negative except what is listed in the HPI  Objective:    Alert and oriented x 4 Speaking in clear sentences with no shortness of breath. No distress.  Impression and Recommendations:    Problem List Items Addressed This Visit     Panic disorder - Primary    Significant panic disorder exacerbated with recent start of venlafaxine.  Unfortunately this did not work well for her as it  has in the past.  She is having a very difficult time managing her anxiety at this time and is experiencing significant interaction with her home and work life.  We did discuss at length the option of an emotional support animal which can be very helpful for management of anxiety symptoms and to reduce panic when it does occur.  Discussed with the patient the importance of evaluating the animal for temperament and nature with recommendations for a calm natured animal that enjoys being near to their owner to best help with her symptoms.  We will provide a letter today for her home to  allow for an emotional support animal.  We did discuss that the ADA does not recognize emotional support animals and therefore this is not a requirement to be allowed within the workplace however I do recommend that she discuss with her human resource department if accommodations can be made to help with her disability of anxiety to allow for an emotional support animal to be with her during work to see if this is helpful for some of the panic symptoms she is experiencing. We also discussed the option of FMLA and I encouraged her to reach out to the HR department with her job to determine if this is an option for her for at least as needed use to help protect her job in the event that her anxiety is so severe she is unable to work.  She plans to apply for Social Security disability through the disability office in the near future.  I will be happy to provide a letter explaining her condition.  She does have significant documented history of multiple hospitalizations for mental health/anxiety and is at high risk of repeat hospitalization if we are unable to get her symptoms under better control. Finally we did discuss medication options that may be helpful for management of her symptoms.  Given that anxiety is her primary symptoms I do recommend that we consider an SSRI such as sertraline which can be very effective.  Patient is in agreement to try this.  We will start out with 25 mg once a day for 4 days and then plan to increase to 50 mg.  We will plan to follow-up in 3 weeks with telephone visit to check and see how she is doing.  This will have to be after 5 PM okay to schedule this at this time.  She will follow-up with me immediately if she begins to have any new or worsening symptoms while on the medication.  She will continue to take the Xanax for severe panic symptoms as needed.      Relevant Medications   sertraline (ZOLOFT) 50 MG tablet   Generalized anxiety disorder    See panic disorder for  complete assessment and plan.      Relevant Medications   sertraline (ZOLOFT) 50 MG tablet   Seasonal allergic rhinitis due to pollen    Symptoms and presentation consistent with seasonal allergic rhinitis.  Discussed with patient that the change in season is currently upon this and allergies can be quite severe in this area.  Recommend Xyzal for allergy management at bedtime.  She is in agreement to this.  We will send in to pharmacy.  If symptoms worsen or fail to improve patient will follow-up.      Relevant Medications   levocetirizine (XYZAL) 5 MG tablet   Vitamin D deficiency    Recent labs show vitamin D deficiency.  This could be contributing to  some of the patient's mood changes.  We will send in prescription for this today.      Relevant Medications   Vitamin D, Ergocalciferol, (DRISDOL) 1.25 MG (50000 UNIT) CAPS capsule    orders and follow up as documented in EMR I discussed the assessment and treatment plan with the patient. The patient was provided an opportunity to ask questions and all were answered. The patient agreed with the plan and demonstrated an understanding of the instructions.   The patient was advised to call back or seek an in-person evaluation if the symptoms worsen or if the condition fails to improve as anticipated.  Follow-Up: 3 weeks with phone check-in- must be after 5  I provided 34 minutes of non-face-to-face interaction with this non face-to-face encounter including intake, same-day documentation, and chart review.   Tollie Eth, NP , DNP, AGNP-c [facility] at [facility] [phone] (fax)

## 2022-06-22 NOTE — Assessment & Plan Note (Signed)
Symptoms and presentation consistent with seasonal allergic rhinitis.  Discussed with patient that the change in season is currently upon this and allergies can be quite severe in this area.  Recommend Xyzal for allergy management at bedtime.  She is in agreement to this.  We will send in to pharmacy.  If symptoms worsen or fail to improve patient will follow-up.

## 2022-06-22 NOTE — Assessment & Plan Note (Addendum)
See panic disorder for complete assessment and plan.

## 2022-06-22 NOTE — Assessment & Plan Note (Signed)
Recent labs show vitamin D deficiency.  This could be contributing to some of the patient's mood changes.  We will send in prescription for this today.

## 2022-06-22 NOTE — Assessment & Plan Note (Addendum)
>>  ASSESSMENT AND PLAN FOR PANIC DISORDER WRITTEN ON 06/22/2022  8:47 PM BY Gaylene Moylan E, NP  Significant panic disorder exacerbated with recent start of venlafaxine.  Unfortunately this did not work well for her as it has in the past.  She is having a very difficult time managing her anxiety at this time and is experiencing significant interaction with her home and work life.  We did discuss at length the option of an emotional support animal which can be very helpful for management of anxiety symptoms and to reduce panic when it does occur.  Discussed with the patient the importance of evaluating the animal for temperament and nature with recommendations for a calm natured animal that enjoys being near to their owner to best help with her symptoms.  We will provide a letter today for her home to allow for an emotional support animal.  We did discuss that the ADA does not recognize emotional support animals and therefore this is not a requirement to be allowed within the workplace however I do recommend that she discuss with her human resource department if accommodations can be made to help with her disability of anxiety to allow for an emotional support animal to be with her during work to see if this is helpful for some of the panic symptoms she is experiencing. We also discussed the option of FMLA and I encouraged her to reach out to the HR department with her job to determine if this is an option for her for at least as needed use to help protect her job in the event that her anxiety is so severe she is unable to work.  She plans to apply for Social Security disability through the disability office in the near future.  I will be happy to provide a letter explaining her condition.  She does have significant documented history of multiple hospitalizations for mental health/anxiety and is at high risk of repeat hospitalization if we are unable to get her symptoms under better control. Finally we did discuss  medication options that may be helpful for management of her symptoms.  Given that anxiety is her primary symptoms I do recommend that we consider an SSRI such as sertraline which can be very effective.  Patient is in agreement to try this.  We will start out with 25 mg once a day for 4 days and then plan to increase to 50 mg.  We will plan to follow-up in 3 weeks with telephone visit to check and see how she is doing.  This will have to be after 5 PM okay to schedule this at this time.  She will follow-up with me immediately if she begins to have any new or worsening symptoms while on the medication.  She will continue to take the Xanax for severe panic symptoms as needed.  >>ASSESSMENT AND PLAN FOR GENERALIZED ANXIETY DISORDER WRITTEN ON 06/22/2022  8:47 PM BY Gwendy Boeder E, NP  See panic disorder for complete assessment and plan.

## 2022-06-25 NOTE — Telephone Encounter (Signed)
Encounter on 9/5 updated and signed by provider

## 2022-06-29 ENCOUNTER — Encounter (HOSPITAL_COMMUNITY): Payer: Self-pay

## 2022-06-29 ENCOUNTER — Ambulatory Visit (HOSPITAL_COMMUNITY)
Admission: EM | Admit: 2022-06-29 | Discharge: 2022-06-29 | Disposition: A | Discharge status: Home / Self Care | Payer: Commercial Managed Care - HMO | Attending: Internal Medicine | Admitting: Internal Medicine

## 2022-06-29 DIAGNOSIS — H6123 Impacted cerumen, bilateral: Secondary | ICD-10-CM

## 2022-06-29 DIAGNOSIS — Z1152 Encounter for screening for COVID-19: Secondary | ICD-10-CM

## 2022-06-29 DIAGNOSIS — R430 Anosmia: Secondary | ICD-10-CM

## 2022-06-29 DIAGNOSIS — J301 Allergic rhinitis due to pollen: Secondary | ICD-10-CM | POA: Insufficient documentation

## 2022-06-29 MED ORDER — FLUTICASONE PROPIONATE 50 MCG/ACT NA SUSP: 1.0000 | Freq: Every day | NASAL | 2 refills | Status: DC

## 2022-06-29 MED ORDER — CETIRIZINE HCL 10 MG PO TABS: 10.0000 mg | ORAL_TABLET | Freq: Every day | ORAL | 2 refills | Status: DC

## 2022-06-29 NOTE — ED Provider Notes (Addendum)
[facility]    CSN: [id number] Arrival date & time: 06/29/22  1751      History   Chief Complaint Chief Complaint  Patient presents with   Nasal Congestion   Loss of taste and smell    HPI Tiffany Welch is a 36 y.o. female.   Patient presents to urgent care for evaluation of loss of taste and smell that started yesterday. States she "feels fine" and denies fatigue, headache, fever/chills, nasal congestion, ear pain, sore throat, and neck pain. Reports she had a headache "the other day" but this fully resolved with ibuprofen. Reports increased sneezing and runny nose. She was recently placed on xyzal allergy medication by her PCP for nasal drainage, but states that this hasn't been helping with her symptoms very much. Denies history of asthma/COPD. She is not a smoker and denies drug use. Denies known sick contacts. She received 2 COVID-19 vaccinations but has not received boosters. Tested negative for COVID-19 yesterday with home test.      Past Medical History:  Diagnosis Date   Anxiety     Patient Active Problem List   Diagnosis Date Noted   Seasonal allergic rhinitis due to pollen 06/22/2022   Vitamin D deficiency 06/22/2022   Wellness examination 06/01/2022   Panic disorder 06/01/2022   Generalized anxiety disorder 06/01/2022    Past Surgical History:  Procedure Laterality Date   CHOLECYSTECTOMY     TUBAL LIGATION      OB History   No obstetric history on file.      Home Medications    Prior to Admission medications   Medication Sig Start Date End Date Taking? Authorizing Provider  ALPRAZolam Prudy Feeler) 0.25 MG tablet Take 1/2 to 1 tab by mouth up to twice a day as needed for panic symptoms. 06/01/22  Yes Early, Sung Amabile, NP  cetirizine (ZYRTEC) 10 MG tablet Take 1 tablet (10 mg total) by mouth daily. 06/29/22  Yes Carlisle Beers, FNP  fluticasone (FLONASE) 50 MCG/ACT nasal spray Place 1 spray into both nostrils daily. 06/29/22  Yes Carlisle Beers, FNP  sertraline (ZOLOFT) 50 MG tablet Take 1/2 tab for the first 4 days then increase to 1 tab by mouth at bedtime. 06/22/22  Yes Early, Sung Amabile, NP  Vitamin D, Ergocalciferol, (DRISDOL) 1.25 MG (50000 UNIT) CAPS capsule Take 1 capsule (50,000 Units total) by mouth every 7 (seven) days. 06/22/22  Yes Early, Sung Amabile, NP  omeprazole (PRILOSEC) 20 MG capsule Take 1 capsule (20 mg total) by mouth daily. 02/22/22   Lamptey, Britta Mccreedy, MD    Family History Family History  Family history unknown: Yes    Social History Social History   Tobacco Use   Smoking status: Never   Smokeless tobacco: Never  Substance Use Topics   Alcohol use: Not Currently   Drug use: Never     Allergies   Codeine and Doxycycline   Review of Systems Review of Systems Per HPI  Physical Exam Triage Vital Signs ED Triage Vitals  Enc Vitals Group     BP 06/29/22 1855 121/74     Pulse Rate 06/29/22 1855 63     Resp 06/29/22 1855 16     Temp 06/29/22 1855 98.2 F (36.8 C)     Temp Source 06/29/22 1855 Oral     SpO2 06/29/22 1855 97 %     Weight --      Height --      Head Circumference --  Peak Flow --      Pain Score 06/29/22 1857 0     Pain Loc --      Pain Edu? --      Excl. in GC? --    No data found.  Updated Vital Signs BP 121/74 (BP Location: Right Wrist)   Pulse 63   Temp 98.2 F (36.8 C) (Oral)   Resp 16   LMP 06/22/2022 (Exact Date)   SpO2 97%   Visual Acuity Right Eye Distance:   Left Eye Distance:   Bilateral Distance:    Right Eye Near:   Left Eye Near:    Bilateral Near:     Physical Exam Vitals and nursing note reviewed.  Constitutional:      Appearance: Normal appearance. She is not ill-appearing or toxic-appearing.     Comments: Very pleasant patient sitting on exam in position of comfort table in no acute distress.   HENT:     Head: Normocephalic and atraumatic.     Right Ear: Hearing and external ear normal. There is impacted cerumen.     Left Ear:  Hearing and external ear normal. There is impacted cerumen.     Nose: Rhinorrhea present.     Mouth/Throat:     Lips: Pink.     Mouth: Mucous membranes are moist.     Pharynx: No oropharyngeal exudate or posterior oropharyngeal erythema.  Eyes:     General: Lids are normal. Vision grossly intact. Gaze aligned appropriately.     Extraocular Movements: Extraocular movements intact.     Conjunctiva/sclera: Conjunctivae normal.  Cardiovascular:     Rate and Rhythm: Normal rate and regular rhythm.     Heart sounds: Normal heart sounds, S1 normal and S2 normal.  Pulmonary:     Effort: Pulmonary effort is normal. No respiratory distress.     Breath sounds: Normal breath sounds and air entry.     Comments: Clear and equal to auscultation bilaterally.  No adventitious sounds heard.  No respiratory distress. Abdominal:     Palpations: Abdomen is soft.  Musculoskeletal:     Cervical back: Neck supple.  Lymphadenopathy:     Cervical: No cervical adenopathy.  Skin:    General: Skin is warm and dry.     Capillary Refill: Capillary refill takes less than 2 seconds.     Findings: No rash.  Neurological:     General: No focal deficit present.     Mental Status: She is alert and oriented to person, place, and time. Mental status is at baseline.     Cranial Nerves: No dysarthria or facial asymmetry.     Gait: Gait is intact.  Psychiatric:        Mood and Affect: Mood normal.        Speech: Speech normal.        Behavior: Behavior normal.        Thought Content: Thought content normal.        Judgment: Judgment normal.      UC Treatments / Results  Labs (all labs ordered are listed, but only abnormal results are displayed) Labs Reviewed  SARS CORONAVIRUS 2 (TAT 6-24 HRS)    EKG   Radiology No results found.  Procedures Procedures (including critical care time)  Medications Ordered in UC Medications - No data to display  Initial Impression / Assessment and Plan / UC Course  I  have reviewed the triage vital signs and the nursing notes.  Pertinent labs & imaging results  that were available during my care of the patient were reviewed by me and considered in my medical decision making (see chart for details).   1. Allergic rhinitis Patient is not experiencing relief of symptoms with current use of Xyzal medication.  Plan to change this to cetirizine once daily and add Flonase therapy for further relief of symptoms.  Patient to apply 2 puffs of Flonase to bilateral nostrils once daily for the first week of medication use then decrease dose to 1 puff to each nostril once daily ongoing for allergic rhinitis symptoms.  Advise follow-up with PCP regarding allergic rhinitis.  2. Encounter for screening for COVID-19 and anosmia Anosmia may be related to COVID-19 infection.  COVID-19 testing is pending.  Patient advised to stay home until COVID-19 test results.  Work note has been given.   3. Bilateral cerumen impaction Ears flushed in clinic with bilateral ear lavage by nursing staff. Exam after ear lavage reveals normal bilateral tympanic membranes and normal ear canals without infection or cerumen impaction. Patient advised to avoid use of Q-tips or other methods of removing ear wax that involve inserting object into the ear canal. She may use over the counter debrox ear drops in the future as needed to soften and remove ear wax.   Discussed physical exam and available lab work findings in clinic with patient.  Counseled patient regarding appropriate use of medications and potential side effects for all medications recommended or prescribed today. Discussed red flag signs and symptoms of worsening condition,when to call the PCP office, return to urgent care, and when to seek higher level of care in the emergency department. Patient verbalizes understanding and agreement with plan. All questions answered. Patient discharged in stable condition.  Final Clinical Impressions(s) / UC  Diagnoses   Final diagnoses:  Allergic rhinitis due to pollen, unspecified seasonality  Encounter for screening for COVID-19  Anosmia  Bilateral impacted cerumen     Discharge Instructions      We have sent your COVID-19 testing off for evaluation and will call you if it is positive.  Start taking cetirizine once daily for your allergies and stop taking Xyzal.  You may take Flonase 2 puffs into each nostril for the first week while you are using this medication and then reduce this to 1 puff in each nostril ongoing to help with your nasal drainage.    We flushed your ears out today. You may use over the counter debrox ear drops as needed to help remove ear wax from your ears in the future.  If you develop any new or worsening symptoms or do not improve in the next 2 to 3 days, please return.  If your symptoms are severe, please go to the emergency room.  Follow-up with your primary care provider for further evaluation and management of your symptoms as well as ongoing wellness visits.  I hope you feel better!     ED Prescriptions     Medication Sig Dispense Auth. Provider   cetirizine (ZYRTEC) 10 MG tablet Take 1 tablet (10 mg total) by mouth daily. 30 tablet Reita May M, FNP   fluticasone [facility]) 50 MCG/ACT nasal spray Place 1 spray into both nostrils daily. 16 g Carlisle Beers, FNP      PDMP not reviewed this encounter.   Carlisle Beers, FNP 07/01/22 2017    Carlisle Beers, FNP 07/01/22 2017

## 2022-06-29 NOTE — Discharge Instructions (Addendum)
We have sent your COVID-19 testing off for evaluation and will call you if it is positive.  Start taking cetirizine once daily for your allergies and stop taking Xyzal.  You may take Flonase 2 puffs into each nostril for the first week while you are using this medication and then reduce this to 1 puff in each nostril ongoing to help with your nasal drainage.    We flushed your ears out today. You may use over the counter debrox ear drops as needed to help remove ear wax from your ears in the future.  If you develop any new or worsening symptoms or do not improve in the next 2 to 3 days, please return.  If your symptoms are severe, please go to the emergency room.  Follow-up with your primary care provider for further evaluation and management of your symptoms as well as ongoing wellness visits.  I hope you feel better!

## 2022-06-29 NOTE — ED Triage Notes (Signed)
Head congestion and loss of taste/smell. Patient sneezing. Runny nose onset yesterday. Congestion x1 month. History of allergies.

## 2022-06-30 LAB — SARS CORONAVIRUS 2 (TAT 6-24 HRS): SARS Coronavirus 2: NEGATIVE

## 2022-07-02 ENCOUNTER — Ambulatory Visit (INDEPENDENT_AMBULATORY_CARE_PROVIDER_SITE_OTHER): Payer: Commercial Managed Care - HMO | Admitting: Family Medicine

## 2022-07-02 ENCOUNTER — Encounter (HOSPITAL_BASED_OUTPATIENT_CLINIC_OR_DEPARTMENT_OTHER): Payer: Self-pay | Admitting: Family Medicine

## 2022-07-02 DIAGNOSIS — J309 Allergic rhinitis, unspecified: Secondary | ICD-10-CM

## 2022-07-02 NOTE — Patient Instructions (Signed)

## 2022-07-02 NOTE — Assessment & Plan Note (Addendum)
Patient has been having some ongoing issues with sinus congestion, rhinorrhea, itchy eyes, scratchy throat.  She did see PCP last month and began treatment for allergic rhinitis.  Unfortunately, symptoms have persisted despite initial treatment.  She most recently did present to urgent care due to continued symptoms as well as developing loss of taste and smell.  Because of new symptoms, she completed home COVID test and she also had coronavirus testing at urgent care, both of these tests were negative.  She has not had any fever, chills, body aches, shortness of breath.  At time of her urgent care evaluation, allergic rhinitis therapy was adjusted and she was switched to Zyrtec and was started on Flonase.  She has been using these regularly for the past few days, has not noticed any significant change in symptoms. On exam, patient is in no acute distress today, does have some audible nasal congestion.  Vital signs are stable, she is afebrile.  Cardiovascular Sam with regular rate and rhythm, no murmur appreciated.  Lungs clear to auscultation bilaterally.  Mild pharyngeal erythema, no tonsillar exudate.  No cervical lymphadenopathy. Given current symptoms are most likely related to allergic rhinitis, do not suspect acute illness at this time.  Advised to continue monitoring symptoms, particularly for any development of fever or shortness of breath Can continue with use of Zyrtec, Flonase, instructed on proper use.  Can also utilize nasal saline spray to assist with sinus irrigation She would also like to proceed with referral to allergist for further evaluation and management, feel this is reasonable, referral placed today

## 2022-07-02 NOTE — Progress Notes (Signed)
    Procedures performed today:    None.  Independent interpretation of notes and tests performed by another provider:   None.  Brief History, Exam, Impression, and Recommendations:    BP 117/63   Pulse 68   Ht 5\' 5"  (1.651 m)   Wt 186 lb 3.2 oz (84.5 kg)   LMP 06/22/2022 (Exact Date)   SpO2 98%   BMI 30.99 kg/m   Allergic rhinitis Patient has been having some ongoing issues with sinus congestion, rhinorrhea, itchy eyes, scratchy throat.  She did see PCP last month and began treatment for allergic rhinitis.  Unfortunately, symptoms have persisted despite initial treatment.  She most recently did present to urgent care due to continued symptoms as well as developing loss of taste and smell.  Because of new symptoms, she completed home COVID test and she also had coronavirus testing at urgent care, both of these tests were negative.  She has not had any fever, chills, body aches, shortness of breath.  At time of her urgent care evaluation, allergic rhinitis therapy was adjusted and she was switched to Zyrtec and was started on Flonase.  She has been using these regularly for the past few days, has not noticed any significant change in symptoms. On exam, patient is in no acute distress today, does have some audible nasal congestion.  Vital signs are stable, she is afebrile.  Cardiovascular Sam with regular rate and rhythm, no murmur appreciated.  Lungs clear to auscultation bilaterally.  Mild pharyngeal erythema, no tonsillar exudate.  No cervical lymphadenopathy. Given current symptoms are most likely related to allergic rhinitis, do not suspect acute illness at this time.  Advised to continue monitoring symptoms, particularly for any development of fever or shortness of breath Can continue with use of Zyrtec, Flonase, instructed on proper use.  Can also utilize nasal saline spray to assist with sinus irrigation She would also like to proceed with referral to allergist for further evaluation  and management, feel this is reasonable, referral placed today  Return if symptoms worsen or fail to improve.   ___________________________________________ Mckenna Gamm de 08/22/2022, MD, ABFM, CAQSM Primary Care and Sports Medicine [facility]

## 2022-07-08 ENCOUNTER — Emergency Department (HOSPITAL_BASED_OUTPATIENT_CLINIC_OR_DEPARTMENT_OTHER): Payer: Commercial Managed Care - HMO

## 2022-07-08 ENCOUNTER — Encounter (HOSPITAL_BASED_OUTPATIENT_CLINIC_OR_DEPARTMENT_OTHER): Payer: Self-pay

## 2022-07-08 ENCOUNTER — Emergency Department (HOSPITAL_BASED_OUTPATIENT_CLINIC_OR_DEPARTMENT_OTHER): Payer: Commercial Managed Care - HMO | Admitting: Radiology

## 2022-07-08 ENCOUNTER — Emergency Department (HOSPITAL_BASED_OUTPATIENT_CLINIC_OR_DEPARTMENT_OTHER)
Admission: EM | Admit: 2022-07-08 | Discharge: 2022-07-08 | Disposition: A | Discharge status: Home / Self Care | Payer: Commercial Managed Care - HMO | Attending: Emergency Medicine | Admitting: Emergency Medicine

## 2022-07-08 ENCOUNTER — Other Ambulatory Visit: Payer: Self-pay

## 2022-07-08 DIAGNOSIS — Z20822 Contact with and (suspected) exposure to covid-19: Secondary | ICD-10-CM | POA: Diagnosis not present

## 2022-07-08 DIAGNOSIS — R0981 Nasal congestion: Secondary | ICD-10-CM | POA: Diagnosis not present

## 2022-07-08 DIAGNOSIS — R591 Generalized enlarged lymph nodes: Secondary | ICD-10-CM

## 2022-07-08 DIAGNOSIS — J069 Acute upper respiratory infection, unspecified: Secondary | ICD-10-CM

## 2022-07-08 DIAGNOSIS — R059 Cough, unspecified: Secondary | ICD-10-CM | POA: Diagnosis present

## 2022-07-08 LAB — CBC WITH DIFFERENTIAL/PLATELET
Abs Immature Granulocytes: 0.02 10*3/uL (ref 0.00–0.07)
Basophils Absolute: 0 10*3/uL (ref 0.0–0.1)
Basophils Relative: 0 %
Eosinophils Absolute: 0.2 10*3/uL (ref 0.0–0.5)
Eosinophils Relative: 2 %
HCT: 36.5 % (ref 36.0–46.0)
Hemoglobin: 12.3 g/dL (ref 12.0–15.0)
Immature Granulocytes: 0 %
Lymphocytes Relative: 7 %
Lymphs Abs: 0.5 10*3/uL — ABNORMAL LOW (ref 0.7–4.0)
MCH: 30.8 pg (ref 26.0–34.0)
MCHC: 33.7 g/dL (ref 30.0–36.0)
MCV: 91.3 fL (ref 80.0–100.0)
Monocytes Absolute: 0.8 10*3/uL (ref 0.1–1.0)
Monocytes Relative: 11 %
Neutro Abs: 5.8 10*3/uL (ref 1.7–7.7)
Neutrophils Relative %: 80 %
Platelets: 217 10*3/uL (ref 150–400)
RBC: 4 MIL/uL (ref 3.87–5.11)
RDW: 13.1 % (ref 11.5–15.5)
WBC: 7.3 10*3/uL (ref 4.0–10.5)
nRBC: 0 % (ref 0.0–0.2)

## 2022-07-08 LAB — BASIC METABOLIC PANEL
Anion gap: 7 (ref 5–15)
BUN: 12 mg/dL (ref 6–20)
CO2: 28 mmol/L (ref 22–32)
Calcium: 9.8 mg/dL (ref 8.9–10.3)
Chloride: 104 mmol/L (ref 98–111)
Creatinine, Ser: 0.7 mg/dL (ref 0.44–1.00)
GFR, Estimated: >60 mL/min (ref >60)
Glucose, Bld: 98 mg/dL (ref 70–99)
Potassium: 4 mmol/L (ref 3.5–5.1)
Sodium: 139 mmol/L (ref 135–145)

## 2022-07-08 LAB — RESP PANEL BY RT-PCR (FLU A&B, COVID) ARPGX2
Influenza A by PCR: NEGATIVE
Influenza B by PCR: NEGATIVE
SARS Coronavirus 2 by RT PCR: NEGATIVE

## 2022-07-08 LAB — D-DIMER, QUANTITATIVE: D-Dimer, Quant: 0.9 ug/mL-FEU — ABNORMAL HIGH (ref 0.00–0.50)

## 2022-07-08 LAB — HCG, SERUM, QUALITATIVE: Preg, Serum: NEGATIVE

## 2022-07-08 MED ORDER — BENZONATATE 100 MG PO CAPS: 100.0000 mg | ORAL_CAPSULE | Freq: Three times a day (TID) | ORAL | 0 refills | Status: DC

## 2022-07-08 MED ORDER — IOHEXOL 350 MG/ML SOLN
75.0000 mL | Freq: Once | INTRAVENOUS | Status: AC | PRN
  Administered 2022-07-08: 75 mL via INTRAVENOUS

## 2022-07-08 MED ORDER — FLUCONAZOLE 150 MG PO TABS: 150.0000 mg | ORAL_TABLET | Freq: Every day | ORAL | 0 refills | Status: DC

## 2022-07-08 MED ORDER — AZITHROMYCIN 250 MG PO TABS: 250.0000 mg | ORAL_TABLET | Freq: Every day | ORAL | 0 refills | Status: DC

## 2022-07-08 MED ORDER — ALBUTEROL SULFATE HFA 108 (90 BASE) MCG/ACT IN AERS
2.0000 | INHALATION_SPRAY | Freq: Once | RESPIRATORY_TRACT | Status: AC
  Administered 2022-07-08: 2 via RESPIRATORY_TRACT
  Filled 2022-07-08: qty 6.7

## 2022-07-08 NOTE — ED Triage Notes (Signed)
Pt states that she has had runny nose, loss of taste/smell for several weeks. Pt states she had negative COVID test. Pt states since the weekend, she has had cough. Pts eyes are red and irritated.  Peppermint tea, honey, cough drops, zyrtec have not helped.

## 2022-07-08 NOTE — Discharge Instructions (Signed)
Your testing today shows no pneumonia or blood clot in the lung.  Take the antibiotics as prescribed for suspected bronchitis.  COVID is still possible despite negative test today.  Your CT scan did show enlarged lymph nodes involving her chest and bilateral armpits.  You to follow-up with your doctor for recheck of these areas as well as follow-up with the oncologist because they may need to be biopsied to ensure there is nothing that could be cancerous.  Return to the ED with difficulty breathing, chest pain, or other concerns

## 2022-07-08 NOTE — ED Provider Notes (Signed)
[facility] EMERGENCY DEPT Provider Note   CSN: [id number] Arrival date & time: 07/08/22  1743     History  Chief Complaint  Patient presents with   Cough    Tiffany Welch is a 36 y.o. female.  Patient presents with ongoing cough and congestion for the past 3 to 4 days.  States her cough is dry nonproductive.  She has been dealing with congestion for the better part of 3 to 4 weeks and been seen by her PCP as well as urgent care.  She has been using antihistamines and Flonase without relief.  Did have some episodes of loss of taste and smell which is since recovered but never did have a positive COVID test.  Since this week and she has had a nonproductive cough and is irritated eyes.  She is been using cough drops and honey without relief as well as Claritin and Zyrtec.  Has some chest tightness with coughing and does feel some shortness of breath.  No fever.  No pain with urination or blood in the urine.  No abdominal pain, nausea or vomiting.  Never did have a positive COVID test.  Came in today because she is tired of coughing.  The history is provided by the patient.  Cough Associated symptoms: fever, rhinorrhea and shortness of breath   Associated symptoms: no chest pain        Home Medications Prior to Admission medications   Medication Sig Start Date End Date Taking? Authorizing Provider  ALPRAZolam Prudy Feeler) 0.25 MG tablet Take 1/2 to 1 tab by mouth up to twice a day as needed for panic symptoms. 06/01/22   Tollie Eth, NP  cetirizine (ZYRTEC) 10 MG tablet Take 1 tablet (10 mg total) by mouth daily. 06/29/22   Carlisle Beers, FNP  fluticasone (FLONASE) 50 MCG/ACT nasal spray Place 1 spray into both nostrils daily. 06/29/22   Carlisle Beers, FNP  omeprazole (PRILOSEC) 20 MG capsule Take 1 capsule (20 mg total) by mouth daily. 02/22/22   Merrilee Jansky, MD  sertraline (ZOLOFT) 50 MG tablet Take 1/2 tab for the first 4 days then increase to 1 tab by  mouth at bedtime. 06/22/22   Tollie Eth, NP  Vitamin D, Ergocalciferol, (DRISDOL) 1.25 MG (50000 UNIT) CAPS capsule Take 1 capsule (50,000 Units total) by mouth every 7 (seven) days. 06/22/22   Tollie Eth, NP      Allergies    Codeine and Doxycycline    Review of Systems   Review of Systems  Constitutional:  Positive for fatigue and fever. Negative for activity change and appetite change.  HENT:  Positive for congestion and rhinorrhea.   Respiratory:  Positive for cough, chest tightness and shortness of breath.   Cardiovascular:  Negative for chest pain.  Gastrointestinal:  Negative for nausea and vomiting.  Genitourinary:  Negative for dysuria and hematuria.  Neurological:  Negative for weakness and light-headedness.   all other systems are negative except as noted in the HPI and PMH.    Physical Exam Updated Vital Signs BP 117/74 (BP Location: Right Arm)   Pulse 97   Temp 98.3 F (36.8 C) (Oral)   Resp 16   Ht 5\' 5"  (1.651 m)   Wt 83.9 kg   LMP 06/22/2022 (Exact Date)   SpO2 95%   BMI 30.79 kg/m  Physical Exam Vitals and nursing note reviewed.  Constitutional:      General: She is not in acute distress.  Appearance: She is well-developed.  HENT:     Head: Normocephalic and atraumatic.     Mouth/Throat:     Pharynx: No oropharyngeal exudate.  Eyes:     Conjunctiva/sclera: Conjunctivae normal.     Pupils: Pupils are equal, round, and reactive to light.  Neck:     Comments: No meningismus. Cardiovascular:     Rate and Rhythm: Normal rate and regular rhythm.     Heart sounds: Normal heart sounds. No murmur heard. Pulmonary:     Effort: Pulmonary effort is normal. No respiratory distress.     Breath sounds: Normal breath sounds. No wheezing.  Abdominal:     Palpations: Abdomen is soft.     Tenderness: There is no abdominal tenderness. There is no guarding or rebound.  Musculoskeletal:        General: No tenderness. Normal range of motion.     Cervical back:  Normal range of motion and neck supple.  Skin:    General: Skin is warm.  Neurological:     Mental Status: She is alert and oriented to person, place, and time.     Cranial Nerves: No cranial nerve deficit.     Motor: No abnormal muscle tone.     Coordination: Coordination normal.     Comments:  5/5 strength throughout. CN 2-12 intact.Equal grip strength.   Psychiatric:        Behavior: Behavior normal.     ED Results / Procedures / Treatments   Labs (all labs ordered are listed, but only abnormal results are displayed) Labs Reviewed  D-DIMER, QUANTITATIVE - Abnormal; Notable for the following components:      Result Value   D-Dimer, Quant 0.90 (*)    All other components within normal limits  CBC WITH DIFFERENTIAL/PLATELET - Abnormal; Notable for the following components:   Lymphs Abs 0.5 (*)    All other components within normal limits  RESP PANEL BY RT-PCR (FLU A&B, COVID) ARPGX2  HCG, SERUM, QUALITATIVE  BASIC METABOLIC PANEL    EKG None  Radiology CT Angio Chest PE W and/or Wo Contrast  Result Date: 07/08/2022 CLINICAL DATA:  Pulmonary embolism (PE) suspected, positive D-dimer. Pt states she had negative COVID test. Pt states since the weekend, she has had cough. EXAM: CT ANGIOGRAPHY CHEST WITH CONTRAST TECHNIQUE: Multidetector CT imaging of the chest was performed using the standard protocol during bolus administration of intravenous contrast. Multiplanar CT image reconstructions and MIPs were obtained to evaluate the vascular anatomy. RADIATION DOSE REDUCTION: This exam was performed according to the departmental dose-optimization program which includes automated exposure control, adjustment of the mA and/or kV according to patient size and/or use of iterative reconstruction technique. CONTRAST:  3mL OMNIPAQUE IOHEXOL 350 MG/ML SOLN COMPARISON:  None Available. FINDINGS: Cardiovascular: Satisfactory opacification of the pulmonary arteries to the segmental level. No central  or segmental evidence of pulmonary embolism. Limited evaluation of the subsegmental level due to timing of contrast. Normal heart size. No significant pericardial effusion. The thoracic aorta is normal in caliber. No atherosclerotic plaque of the thoracic aorta. No coronary artery calcifications. Mediastinum/Nodes: Bilateral borderline enlarged axillary lymph nodes measuring 1 cm. There is a 1.1 cm right hilar lymph node that is enlarged. No enlarged left hilar lymph node. No enlarged mediastina lymph nodes. Thyroid gland, trachea, and esophagus demonstrate no significant findings. Lungs/Pleura: No focal consolidation. 4 mm right upper lobe pulmonary micronodule (6:27). No pulmonary mass. No pleural effusion. No pneumothorax. Upper Abdomen: No acute abnormality. Musculoskeletal: No chest wall  abnormality. No suspicious lytic or blastic osseous lesions. No acute displaced fracture. Review of the MIP images confirms the above findings. IMPRESSION: 1. No central or segmental pulmonary embolus. Limited evaluation of the subsegmental level due to timing of contrast. 2. Indeterminate borderline enlarged bilateral axillary lymph nodes as well as right hilar lymph node. Recommend attention on follow-up. 3. Right upper lobe pulmonary micronodule. No follow-up needed if patient is low-risk.This recommendation follows the consensus statement: Guidelines for Management of Incidental Pulmonary Nodules Detected on CT Images: From the Fleischner Society 2017; Radiology 2017; 284:228-243. Electronically Signed   By: Iven Finn M.D.   On: 07/08/2022 22:18   DG Chest 2 View  Result Date: 07/08/2022 CLINICAL DATA:  Cough EXAM: CHEST - 2 VIEW COMPARISON:  02/22/2022 FINDINGS: Cardiac and mediastinal contours are within normal limits. No focal pulmonary opacity. No pleural effusion or pneumothorax. No acute osseous abnormality. IMPRESSION: No acute cardiopulmonary process. Electronically Signed   By: Merilyn Baba M.D.   On:  07/08/2022 18:40    Procedures Procedures    Medications Ordered in ED Medications  albuterol (VENTOLIN HFA) 108 (90 Base) MCG/ACT inhaler 2 puff (2 puffs Inhalation Given 07/08/22 2055)    ED Course/ Medical Decision Making/ A&P                           Medical Decision Making Amount and/or Complexity of Data Reviewed Labs: ordered. Decision-making details documented in ED Course. Radiology: ordered and independent interpretation performed. Decision-making details documented in ED Course. ECG/medicine tests: ordered and independent interpretation performed. Decision-making details documented in ED Course.  Risk Prescription drug management.   Cough and congestion for the past 3 to 4 days.  Some chest tightness and shortness of breath with coughing.  No fever hypoxia or increased work of breathing.  COVID testing is negative.  Chest x-ray is negative for infiltrate or pneumonia.  Suspect likely viral URI, discussed COVID is still possible despite negative test today and previously. D-dimer was positive.  CT scan shows no pulmonary embolism or pneumonia.  Does show borderline axillary and hilar lymphadenopathy which needs follow-up  We will treat for bronchitis with antibiotics, decongestants and bronchodilators and nasal steroids.  Follow-up with PCP.  Patient made aware of enlarged lymph nodes and need for follow-up. No hypoxia with ambulation.  Return precautions discussed.         Final Clinical Impression(s) / ED Diagnoses Final diagnoses:  None    Rx / DC Orders ED Discharge Orders     None         Rafeef Lau, Annie Main, MD 07/09/22 [phone]

## 2022-07-13 ENCOUNTER — Ambulatory Visit (HOSPITAL_BASED_OUTPATIENT_CLINIC_OR_DEPARTMENT_OTHER): Payer: Commercial Managed Care - HMO | Admitting: Nurse Practitioner

## 2022-07-15 ENCOUNTER — Ambulatory Visit (INDEPENDENT_AMBULATORY_CARE_PROVIDER_SITE_OTHER): Payer: Commercial Managed Care - HMO | Admitting: Nurse Practitioner

## 2022-07-15 ENCOUNTER — Encounter (HOSPITAL_BASED_OUTPATIENT_CLINIC_OR_DEPARTMENT_OTHER): Payer: Self-pay | Admitting: Nurse Practitioner

## 2022-07-15 ENCOUNTER — Other Ambulatory Visit (HOSPITAL_BASED_OUTPATIENT_CLINIC_OR_DEPARTMENT_OTHER): Payer: Self-pay

## 2022-07-15 VITALS — BP 120/67 | HR 53 | Ht 65.0 in | Wt 186.7 lb

## 2022-07-15 DIAGNOSIS — B9689 Other specified bacterial agents as the cause of diseases classified elsewhere: Secondary | ICD-10-CM | POA: Insufficient documentation

## 2022-07-15 DIAGNOSIS — J069 Acute upper respiratory infection, unspecified: Secondary | ICD-10-CM | POA: Diagnosis not present

## 2022-07-15 HISTORY — DX: Other specified bacterial agents as the cause of diseases classified elsewhere: B96.89

## 2022-07-15 MED ORDER — PROMETHAZINE-DM 6.25-15 MG/5ML PO SYRP
5.0000 mL | ORAL_SOLUTION | Freq: Four times a day (QID) | ORAL | 0 refills | Status: DC | PRN
  Filled 2022-07-15: qty 118, 6d supply, fill #0

## 2022-07-15 MED ORDER — DEXAMETHASONE SODIUM PHOSPHATE 10 MG/ML IJ SOLN
10.0000 mg | Freq: Once | INTRAMUSCULAR | Status: AC
  Administered 2022-07-15: 10 mg via INTRAMUSCULAR

## 2022-07-15 MED ORDER — CEFTRIAXONE SODIUM 500 MG IJ SOLR
500.0000 mg | Freq: Once | INTRAMUSCULAR | Status: AC
  Administered 2022-07-15: 500 mg via INTRAMUSCULAR

## 2022-07-15 MED ORDER — PREDNISONE 50 MG PO TABS
50.0000 mg | ORAL_TABLET | Freq: Every day | ORAL | 0 refills | Status: DC
  Filled 2022-07-15: qty 5, 5d supply, fill #0

## 2022-07-15 NOTE — Progress Notes (Signed)
Tollie Eth, DNP, AGNP-c Primary Care & Sports Medicine [address]  [address] [place], [place] [address] [phone]  Subjective:   Tiffany Welch is a 36 y.o. female presents to day for evaluation of: Hospitalization Follow-up (Patient presents today for hospital follow up. Nasty hacking cough still not any better. )  Tiffany Welch endorses: Allergy symptoms in Tiffany Welch to mid August with no improvement Seen in our office twice in August then presented to ED when symptoms did not improve on 09/21 In ED tested for COVID, Pna, Flu -all negative Cough severe, constant with sputum production Loss taste and smell, congestion severe No CP, ShoB (except when coughing fit), fevers She has been out of work since Friday Short term disability ([employer]) should be sent yesterday Azithromycin prescribed by ED finished Not feeling any better Unable to sleep due to coughing   Out of work Friday Monday Tuesday Wednesday and today  PMH, Medications, and Allergies reviewed and updated in chart as appropriate.   ROS negative except for what is listed in HPI. Objective:  BP 120/67   Pulse (!) 53   Ht 5\' 5"  (1.651 m)   Wt 186 lb 11.2 oz (84.7 kg)   LMP 06/22/2022 (Exact Date)   SpO2 99%   BMI 31.07 kg/m  Physical Exam Vitals and nursing note reviewed.  Constitutional:      Appearance: She is ill-appearing.  HENT:     Head: Normocephalic.     Nose:     Comments: Mask in place    Mouth/Throat:     Comments: Mask in place Eyes:     Extraocular Movements: Extraocular movements intact.     Pupils: Pupils are equal, round, and reactive to light.  Cardiovascular:     Rate and Rhythm: Normal rate and regular rhythm.     Pulses: Normal pulses.     Heart sounds: Normal heart sounds.  Pulmonary:     Breath sounds: Wheezing and rhonchi present.     Comments: Continuous fits of coughing with short intervals in between.  Lymphadenopathy:     Cervical: Cervical adenopathy  present.  Skin:    General: Skin is warm and dry.     Capillary Refill: Capillary refill takes less than 2 seconds.  Neurological:     General: No focal deficit present.     Mental Status: She is alert and oriented to person, place, and time.     Motor: Weakness present.  Psychiatric:        Mood and Affect: Mood normal.        Behavior: Behavior normal.        Thought Content: Thought content normal.        Judgment: Judgment normal.           Assessment & Plan:   Problem List Items Addressed This Visit     Bacterial URI - Primary    Ongoing cough for at least the last 5 weeks. Recent imaging at ED reviewed today. Given the length of time and severity of symptoms I am concerned for possible infectious source. We will give Rocephin 500mg  IM today with Dexamethasone 10mg  IM. Plan to start prednisone 50mg  per day tomorrow for 5 days. Rest and supportive care recommended. Patient has been out of work since last week and reports that she will need short term disability paperwork completed. This will be faxed from her office. We will plan to request 4 weeks to allow time for recovery- start date Friday  07/09/2022. Follow-up in 1 week if no improvement of symptoms. Consider pulmonology referral if no further improvement with current treatment measures.       Relevant Medications   predniSONE (DELTASONE) 50 MG tablet   promethazine-dextromethorphan (PROMETHAZINE-DM) 6.25-15 MG/5ML syrup      Tiffany Render, DNP, AGNP-c 07/15/2022  8:48 PM    History, Medications, Surgery, SDOH, and Family History reviewed and updated as appropriate.

## 2022-07-15 NOTE — Assessment & Plan Note (Addendum)
Ongoing cough for at least the last 5 weeks. Recent imaging at ED reviewed today. Given the length of time and severity of symptoms I am concerned for possible infectious source. We will give Rocephin 500mg  IM today with Dexamethasone 10mg  IM. Plan to start prednisone 50mg  per day tomorrow for 5 days. Rest and supportive care recommended. Patient has been out of work since last week and reports that she will need short term disability paperwork completed. This will be faxed from her office. We will plan to request 4 weeks to allow time for recovery- start date Friday 07/09/2022. Follow-up in 1 week if no improvement of symptoms. Consider pulmonology referral if no further improvement with current treatment measures.

## 2022-07-15 NOTE — Patient Instructions (Signed)
Please let me know if you are not feeling better. Rest!!   I will keep an eye out for the Short term disability paperwork.

## 2022-07-28 NOTE — Telephone Encounter (Signed)
Tiffany Welch,   Can you check on the FMLA paperwork for Ova from [employer]? Thank you! SaraBeth

## 2022-07-30 ENCOUNTER — Telehealth (HOSPITAL_BASED_OUTPATIENT_CLINIC_OR_DEPARTMENT_OTHER): Payer: Self-pay

## 2022-07-30 NOTE — Telephone Encounter (Signed)
FMLA paper work was placed in Administrator, arts.

## 2022-08-03 NOTE — Telephone Encounter (Signed)
FMLA Paperwork completed and placed in CMA tray to be faxed. It appears that patients consent has not been signed, this may need to be signed prior to faxing.

## 2022-08-04 DIAGNOSIS — Z0289 Encounter for other administrative examinations: Secondary | ICD-10-CM

## 2022-08-04 NOTE — Telephone Encounter (Signed)
FMLA sent for SCAN  Pt picked up a copy Charges entered

## 2022-08-24 ENCOUNTER — Other Ambulatory Visit: Payer: Self-pay

## 2022-08-24 ENCOUNTER — Ambulatory Visit (INDEPENDENT_AMBULATORY_CARE_PROVIDER_SITE_OTHER): Payer: Commercial Managed Care - HMO | Admitting: Internal Medicine

## 2022-08-24 ENCOUNTER — Encounter: Payer: Self-pay | Admitting: Internal Medicine

## 2022-08-24 VITALS — BP 114/80 | HR 60 | Temp 98.0°F | Resp 16 | Ht 65.5 in | Wt 184.3 lb

## 2022-08-24 DIAGNOSIS — R053 Chronic cough: Secondary | ICD-10-CM

## 2022-08-24 DIAGNOSIS — J301 Allergic rhinitis due to pollen: Secondary | ICD-10-CM | POA: Diagnosis not present

## 2022-08-24 MED ORDER — AZELASTINE HCL 0.1 % NA SOLN: 2.0000 | Freq: Two times a day (BID) | NASAL | 3 refills | Status: DC

## 2022-08-24 MED ORDER — FLUTICASONE PROPIONATE 50 MCG/ACT NA SUSP: 1.0000 | Freq: Every day | NASAL | 3 refills | Status: DC

## 2022-08-24 MED ORDER — ALBUTEROL SULFATE HFA 108 (90 BASE) MCG/ACT IN AERS: 2.0000 | INHALATION_SPRAY | Freq: Four times a day (QID) | RESPIRATORY_TRACT | 1 refills | Status: DC | PRN

## 2022-08-24 MED ORDER — FLOVENT HFA 110 MCG/ACT IN AERO: 2.0000 | INHALATION_SPRAY | Freq: Two times a day (BID) | RESPIRATORY_TRACT | 3 refills | Status: DC

## 2022-08-24 MED ORDER — FLUTICASONE PROPIONATE HFA 110 MCG/ACT IN AERO: 2.0000 | INHALATION_SPRAY | Freq: Two times a day (BID) | RESPIRATORY_TRACT | 3 refills | Status: DC

## 2022-08-24 NOTE — Patient Instructions (Addendum)
Chronic Cough - MDI technique discussed.   - Maintenance inhaler: start Flovent 145mcg 2 puffs twice daily with spacer.  - Rescue inhaler: Albuterol 2 puffs via spacer or 1 vial via nebulizer every 4-6 hours as needed for respiratory symptoms of cough, shortness of breath, or wheezing Asthma control goals:  Full participation in all desired activities (may need albuterol before activity) Albuterol use two times or less a week on average (not counting use with activity) Cough interfering with sleep two times or less a month Oral steroids no more than once a year No hospitalizations  Rhinitis: - Positive skin test 08/2022 to [facility]. - Avoidance measures discussed. - Use nasal saline rinses before nose sprays such as with Neilmed Sinus Rinse.  Use distilled water.   - Use Flonase 2 sprays each nostril daily. Aim upward and outward. - Use Azelastine 2 sprays each nostril twice daily. Aim upward and outward.  Return in about 6 weeks (around 10/05/2022).
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
## 2022-09-03 ENCOUNTER — Encounter (HOSPITAL_BASED_OUTPATIENT_CLINIC_OR_DEPARTMENT_OTHER): Payer: Self-pay | Admitting: Nurse Practitioner

## 2022-09-03 DIAGNOSIS — B9689 Other specified bacterial agents as the cause of diseases classified elsewhere: Secondary | ICD-10-CM

## 2022-09-04 MED ORDER — METRONIDAZOLE 0.75 % VA GEL: 1.0000 | Freq: Every day | VAGINAL | 0 refills | Status: DC

## 2022-09-21 ENCOUNTER — Ambulatory Visit (INDEPENDENT_AMBULATORY_CARE_PROVIDER_SITE_OTHER): Payer: Commercial Managed Care - HMO | Admitting: Nurse Practitioner

## 2022-09-21 ENCOUNTER — Encounter: Payer: Self-pay | Admitting: Nurse Practitioner

## 2022-09-21 VITALS — BP 124/82 | HR 86 | Temp 98.3°F | Wt 180.4 lb

## 2022-09-21 DIAGNOSIS — F410 Panic disorder [episodic paroxysmal anxiety] without agoraphobia: Secondary | ICD-10-CM

## 2022-09-21 DIAGNOSIS — Z20828 Contact with and (suspected) exposure to other viral communicable diseases: Secondary | ICD-10-CM

## 2022-09-21 DIAGNOSIS — H66002 Acute suppurative otitis media without spontaneous rupture of ear drum, left ear: Secondary | ICD-10-CM | POA: Diagnosis not present

## 2022-09-21 DIAGNOSIS — R52 Pain, unspecified: Secondary | ICD-10-CM | POA: Diagnosis not present

## 2022-09-21 DIAGNOSIS — R509 Fever, unspecified: Secondary | ICD-10-CM

## 2022-09-21 LAB — POCT INFLUENZA A/B
Influenza A, POC: NEGATIVE
Influenza B, POC: NEGATIVE

## 2022-09-21 LAB — POC COVID19 BINAXNOW: SARS Coronavirus 2 Ag: NEGATIVE

## 2022-09-21 MED ORDER — OSELTAMIVIR PHOSPHATE 75 MG PO CAPS: 75.0000 mg | ORAL_CAPSULE | Freq: Two times a day (BID) | ORAL | 0 refills | Status: DC

## 2022-09-21 MED ORDER — AMOXICILLIN-POT CLAVULANATE 875-125 MG PO TABS: 1.0000 | ORAL_TABLET | Freq: Two times a day (BID) | ORAL | 0 refills | Status: DC

## 2022-09-21 MED ORDER — ALPRAZOLAM 0.25 MG PO TABS: ORAL_TABLET | ORAL | 0 refills | Status: DC

## 2022-09-21 NOTE — Progress Notes (Signed)
Tollie Eth, DNP, AGNP-c [facility] [address] [place], [place] [address] [phone]  Subjective:   Tiffany Welch is a 36 y.o. female presents to day for evaluation of: Congestion Saturday developed congestion on the right side of her face.  On Sunday she reports suddenly feeling achy with fevers, sweating, clamminess, nausea, diarrhea, fatigue, and heart palpitations.  She has been around coworkers who have tested positive for flu a. She does have chronic congestion since June (she has been seen by the allergist)   PMH, Medications, and Allergies reviewed and updated in chart as appropriate.   ROS negative except for what is listed in HPI. Objective:  BP 124/82   Pulse 86   Temp 98.3 F (36.8 C)   Wt 180 lb 6.4 oz (81.8 kg)   BMI 29.56 kg/m  Physical Exam Vitals and nursing note reviewed.  Constitutional:      Appearance: She is ill-appearing.  HENT:     Head: Normocephalic.     Right Ear: No middle ear effusion.     Left Ear: Tenderness present. A middle ear effusion is present.     Nose: Congestion and rhinorrhea present.     Right Sinus: Maxillary sinus tenderness and frontal sinus tenderness present.     Left Sinus: Maxillary sinus tenderness and frontal sinus tenderness present.     Mouth/Throat:     Pharynx: Posterior oropharyngeal erythema present.  Eyes:     Pupils: Pupils are equal, round, and reactive to light.  Cardiovascular:     Rate and Rhythm: Normal rate and regular rhythm.     Pulses: Normal pulses.     Heart sounds: Normal heart sounds.  Pulmonary:     Effort: Pulmonary effort is normal.     Breath sounds: Normal breath sounds.  Abdominal:     Palpations: Abdomen is soft.  Skin:    General: Skin is warm and dry.     Capillary Refill: Capillary refill takes less than 2 seconds.  Neurological:     General: No focal deficit present.     Mental Status: She is alert.  Psychiatric:        Mood and Affect: Mood normal.            Assessment & Plan:   Problem List Items Addressed This Visit     Panic disorder    Chronic.  Well-controlled with as needed Xanax.  Refill provided today.      Relevant Medications   ALPRAZolam (XANAX) 0.25 MG tablet   Exposure to the flu - Primary    Recent flu exposure with sudden onset of flulike symptoms.  Given the patient's exposure and symptoms today we will go ahead and begin treatment with Tamiflu.  Patient encouraged to rest, stay well-hydrated, utilize Tylenol and ibuprofen to help with body aches, fevers, and pain, and avoid interaction with others until she has been fever free for at least 24 hours.  I do encourage the patient to wear a mask for at least 5 days after going back to normal activities to help best protect herself from secondary infections.  If any new or worsening symptoms present she will follow-up.      Relevant Medications   oseltamivir (TAMIFLU) 75 MG capsule   Non-recurrent acute suppurative otitis media of left ear without spontaneous rupture of tympanic membrane    Left ear does show erythema and bulging of the tympanic membrane with purulent matter noted behind the TM.  Likely a secondary infection  present.  This probably came on prior to her exposure to the flu given the way the TM looks at this time.  Will go ahead and send treatment with antibiotics for her to help clear this.      Relevant Medications   oseltamivir (TAMIFLU) 75 MG capsule   amoxicillin-clavulanate (AUGMENTIN) 875-125 MG tablet   Other Visit Diagnoses     Fever, unspecified fever cause       Relevant Medications   oseltamivir (TAMIFLU) 75 MG capsule   amoxicillin-clavulanate (AUGMENTIN) 875-125 MG tablet   Other Relevant Orders   Influenza A/B (Completed)   Body aches       Relevant Orders   POC COVID-19 (Completed)         Tollie Eth, DNP, AGNP-c 10/06/2022  7:08 PM    History, Medications, Surgery, SDOH, and Family History reviewed and updated as  appropriate.

## 2022-09-21 NOTE — Patient Instructions (Signed)
I want you to rest as much as possible. Stay home for the rest of the week to help protect yourself.  Have them fax the FMLA paperwork to [phone].

## 2022-09-29 ENCOUNTER — Encounter: Payer: Self-pay | Admitting: Nurse Practitioner

## 2022-09-29 MED ORDER — METRONIDAZOLE 500 MG PO TABS: 500.0000 mg | ORAL_TABLET | Freq: Two times a day (BID) | ORAL | 0 refills | Status: AC

## 2022-10-05 ENCOUNTER — Ambulatory Visit: Payer: Commercial Managed Care - HMO | Admitting: Internal Medicine

## 2022-10-05 DIAGNOSIS — J309 Allergic rhinitis, unspecified: Secondary | ICD-10-CM

## 2022-10-06 ENCOUNTER — Encounter: Payer: Self-pay | Admitting: Nurse Practitioner

## 2022-10-06 DIAGNOSIS — H66002 Acute suppurative otitis media without spontaneous rupture of ear drum, left ear: Secondary | ICD-10-CM

## 2022-10-06 DIAGNOSIS — Z20828 Contact with and (suspected) exposure to other viral communicable diseases: Secondary | ICD-10-CM

## 2022-10-06 HISTORY — DX: Contact with and (suspected) exposure to other viral communicable diseases: Z20.828

## 2022-10-06 HISTORY — DX: Acute suppurative otitis media without spontaneous rupture of ear drum, left ear: H66.002

## 2022-10-06 NOTE — Assessment & Plan Note (Signed)
Recent flu exposure with sudden onset of flulike symptoms.  Given the patient's exposure and symptoms today we will go ahead and begin treatment with Tamiflu.  Patient encouraged to rest, stay well-hydrated, utilize Tylenol and ibuprofen to help with body aches, fevers, and pain, and avoid interaction with others until she has been fever free for at least 24 hours.  I do encourage the patient to wear a mask for at least 5 days after going back to normal activities to help best protect herself from secondary infections.  If any new or worsening symptoms present she will follow-up.

## 2022-10-06 NOTE — Assessment & Plan Note (Signed)
Left ear does show erythema and bulging of the tympanic membrane with purulent matter noted behind the TM.  Likely a secondary infection present.  This probably came on prior to her exposure to the flu given the way the TM looks at this time.  Will go ahead and send treatment with antibiotics for her to help clear this.

## 2022-10-06 NOTE — Assessment & Plan Note (Signed)
Chronic.  Well-controlled with as needed Xanax.  Refill provided today.

## 2022-10-10 ENCOUNTER — Emergency Department (HOSPITAL_BASED_OUTPATIENT_CLINIC_OR_DEPARTMENT_OTHER): Payer: Commercial Managed Care - HMO | Admitting: Radiology

## 2022-10-10 ENCOUNTER — Other Ambulatory Visit: Payer: Self-pay

## 2022-10-10 ENCOUNTER — Emergency Department (HOSPITAL_BASED_OUTPATIENT_CLINIC_OR_DEPARTMENT_OTHER)
Admission: EM | Admit: 2022-10-10 | Discharge: 2022-10-10 | Disposition: A | Discharge status: Home / Self Care | Payer: Commercial Managed Care - HMO | Attending: Emergency Medicine | Admitting: Emergency Medicine

## 2022-10-10 DIAGNOSIS — R0789 Other chest pain: Secondary | ICD-10-CM

## 2022-10-10 DIAGNOSIS — R079 Chest pain, unspecified: Secondary | ICD-10-CM | POA: Insufficient documentation

## 2022-10-10 LAB — CBC
HCT: 37.4 % (ref 36.0–46.0)
Hemoglobin: 12.5 g/dL (ref 12.0–15.0)
MCH: 31 pg (ref 26.0–34.0)
MCHC: 33.4 g/dL (ref 30.0–36.0)
MCV: 92.8 fL (ref 80.0–100.0)
Platelets: 234 10*3/uL (ref 150–400)
RBC: 4.03 MIL/uL (ref 3.87–5.11)
RDW: 12.5 % (ref 11.5–15.5)
WBC: 4.5 10*3/uL (ref 4.0–10.5)
nRBC: 0 % (ref 0.0–0.2)

## 2022-10-10 LAB — BASIC METABOLIC PANEL
Anion gap: 7 (ref 5–15)
BUN: 8 mg/dL (ref 6–20)
CO2: 26 mmol/L (ref 22–32)
Calcium: 9.7 mg/dL (ref 8.9–10.3)
Chloride: 104 mmol/L (ref 98–111)
Creatinine, Ser: 0.63 mg/dL (ref 0.44–1.00)
GFR, Estimated: >60 mL/min (ref >60)
Glucose, Bld: 90 mg/dL (ref 70–99)
Potassium: 4 mmol/L (ref 3.5–5.1)
Sodium: 137 mmol/L (ref 135–145)

## 2022-10-10 LAB — TROPONIN I (HIGH SENSITIVITY): Troponin I (High Sensitivity): 2 ng/L (ref <18)

## 2022-10-10 LAB — PREGNANCY, URINE: Preg Test, Ur: NEGATIVE

## 2022-10-10 MED ORDER — IBUPROFEN 800 MG PO TABS
800.0000 mg | ORAL_TABLET | Freq: Once | ORAL | Status: AC
  Administered 2022-10-10: 800 mg via ORAL
  Filled 2022-10-10: qty 1

## 2022-10-10 NOTE — Discharge Instructions (Signed)
You have been seen today for your complaint of chest pain. Your lab work was reassuring and showed no abnormalities. Your imaging was reassuring and showed no normalities. Your discharge medications include Alternate tylenol and ibuprofen for pain. You may alternate these every 4 hours. You may take up to 800 mg of ibuprofen at a time and up to 1000 mg of tylenol. Follow up with: Your primary care provider in 1 week Please seek immediate medical care if you develop any of the following symptoms: Your chest pain gets worse. You have a cough that gets worse, or you cough up blood. You have severe pain in your abdomen. You faint. You have sudden, unexplained chest discomfort. You have sudden, unexplained discomfort in your arms, back, neck, or jaw. You have shortness of breath at any time. You suddenly start to sweat, or your skin gets clammy. You feel nausea or you vomit. You suddenly feel lightheaded or dizzy. You have severe weakness, or unexplained weakness or fatigue. Your heart begins to beat quickly, or it feels like it is skipping beats. At this time there does not appear to be the presence of an emergent medical condition, however there is always the potential for conditions to change. Please read and follow the below instructions.  Do not take your medicine if  develop an itchy rash, swelling in your mouth or lips, or difficulty breathing; call 911 and seek immediate emergency medical attention if this occurs.  You may review your lab tests and imaging results in their entirety on your MyChart account.  Please discuss all results of fully with your primary care provider and other specialist at your follow-up visit.  Note: Portions of this text may have been transcribed using voice recognition software. Every effort was made to ensure accuracy; however, inadvertent computerized transcription errors may still be present.

## 2022-10-10 NOTE — ED Provider Notes (Signed)
[facility] EMERGENCY DEPT Provider Note   CSN: [id number] Arrival date & time: 10/10/22  1359     History  Chief Complaint  Patient presents with   Chest Pain    Tiffany Welch is a 36 y.o. female.  With history of generalized anxiety, panic disorder who presents to the ED for evaluation of 7 days of left-sided chest pain.  States that pain has not changed in the past 7 days.  Does get slightly better with Tylenol.  She does not have any associated shortness of breath.  There is no radiation.  States the pain feels like someone is pulling her chest.  She states she does have significant anxiety and was recently prescribed Ativan for her symptoms.  She takes this and typically has full resolution of her symptoms shortly afterwards.  Presented today to rule out cardiac emergencies.  States she has been under a significant amount of stress lately and has had numerous upper respiratory infections.  She has also having mild intermittent headache.  Headache is currently 5 out of 10.  No other neurologic symptoms.  Denies cough, congestion, rhinorrhea or sore throat.   Chest Pain Associated symptoms: headache        Home Medications Prior to Admission medications   Medication Sig Start Date End Date Taking? Authorizing Provider  albuterol (VENTOLIN HFA) 108 (90 Base) MCG/ACT inhaler Inhale 2 puffs into the lungs every 6 (six) hours as needed for wheezing or shortness of breath. 08/24/22   Birder Robson, MD  ALPRAZolam Prudy Feeler) 0.25 MG tablet Take 1/2 to 1 tab by mouth up to twice a day as needed for panic symptoms. 09/21/22   Tollie Eth, NP  amoxicillin-clavulanate (AUGMENTIN) 875-125 MG tablet Take 1 tablet by mouth 2 (two) times daily. 09/21/22   Tollie Eth, NP  azelastine (ASTELIN) 0.1 % nasal spray Place 2 sprays into both nostrils 2 (two) times daily. Use in each nostril as directed 08/24/22   Birder Robson, MD  cetirizine (ZYRTEC) 10 MG tablet Take 1 tablet (10 mg  total) by mouth daily. Patient not taking: Reported on 09/21/2022 06/29/22   Carlisle Beers, FNP  FLOVENT HFA 110 MCG/ACT inhaler Inhale 2 puffs into the lungs in the morning and at bedtime. 08/24/22   Birder Robson, MD  fluticasone (FLONASE) 50 MCG/ACT nasal spray Place 1 spray into both nostrils daily. 08/24/22   Birder Robson, MD  omeprazole (PRILOSEC) 20 MG capsule Take 1 capsule (20 mg total) by mouth daily. 02/22/22   Lamptey, Britta Mccreedy, MD  oseltamivir (TAMIFLU) 75 MG capsule Take 1 capsule (75 mg total) by mouth 2 (two) times daily. 09/21/22   Tollie Eth, NP  sertraline (ZOLOFT) 50 MG tablet Take 1/2 tab for the first 4 days then increase to 1 tab by mouth at bedtime. Patient not taking: Reported on 09/21/2022 06/22/22   Early, Sung Amabile, NP  Vitamin D, Ergocalciferol, (DRISDOL) 1.25 MG (50000 UNIT) CAPS capsule Take 1 capsule (50,000 Units total) by mouth every 7 (seven) days. 06/22/22   Tollie Eth, NP      Allergies    Codeine and Doxycycline    Review of Systems   Review of Systems  Cardiovascular:  Positive for chest pain.  Neurological:  Positive for headaches.  All other systems reviewed and are negative.   Physical Exam Updated Vital Signs BP 111/73   Pulse 86   Temp 98.4 F (36.9 C) (Oral)   Resp Marland Kitchen)  21   Ht 5\' 5"  (1.651 m)   Wt 81.6 kg   LMP 08/22/2022 (Exact Date)   SpO2 100%   BMI 29.95 kg/m  Physical Exam Vitals and nursing note reviewed.  Constitutional:      General: She is not in acute distress.    Appearance: She is well-developed.  HENT:     Head: Normocephalic and atraumatic.  Eyes:     Conjunctiva/sclera: Conjunctivae normal.  Cardiovascular:     Rate and Rhythm: Normal rate and regular rhythm.     Heart sounds: No murmur heard. Pulmonary:     Effort: Pulmonary effort is normal. No respiratory distress.     Breath sounds: Normal breath sounds. No decreased breath sounds, wheezing, rhonchi or rales.  Abdominal:     Palpations: Abdomen is  soft.     Tenderness: There is no abdominal tenderness.  Musculoskeletal:        General: No swelling.     Cervical back: Neck supple.  Skin:    General: Skin is warm and dry.     Capillary Refill: Capillary refill takes less than 2 seconds.  Neurological:     General: No focal deficit present.     Mental Status: She is alert and oriented to person, place, and time.     Cranial Nerves: No cranial nerve deficit.  Psychiatric:        Mood and Affect: Mood normal.     ED Results / Procedures / Treatments   Labs (all labs ordered are listed, but only abnormal results are displayed) Labs Reviewed  BASIC METABOLIC PANEL  CBC  PREGNANCY, URINE  TROPONIN I (HIGH SENSITIVITY)    EKG EKG Interpretation  Date/Time:  Sunday October 10 2022 14:13:23 EST Ventricular Rate:  66 PR Interval:  134 QRS Duration: 74 QT Interval:  364 QTC Calculation: 381 R Axis:   30 Text Interpretation: Normal sinus rhythm with sinus arrhythmia Normal ECG When compared with ECG of 22-Feb-2022 13:33, No significant change was found Confirmed by 24-Feb-2022 [phone]) on 10/10/2022 2:51:15 PM  Radiology DG Chest 2 View  Result Date: 10/10/2022 CLINICAL DATA:  Anxiety and chest pain EXAM: CHEST - 2 VIEW COMPARISON:  Chest x-ray dated July 08, 2022 FINDINGS: The heart size and mediastinal contours are within normal limits. Both lungs are clear. The visualized skeletal structures are unremarkable. IMPRESSION: No active cardiopulmonary disease. Electronically Signed   By: July 10, 2022 M.D.   On: 10/10/2022 14:47    Procedures Procedures    Medications Ordered in ED Medications  ibuprofen (ADVIL) tablet 800 mg (has no administration in time range)    ED Course/ Medical Decision Making/ A&P           HEART Score: 0                Medical Decision Making Amount and/or Complexity of Data Reviewed Labs: ordered. Radiology: ordered.  Risk Prescription drug management.  This patient  presents to the ED for concern of chest pain, this involves an extensive number of treatment options, and is a complaint that carries with it a high risk of complications and morbidity. The emergent differential diagnosis of chest pain includes: Acute coronary syndrome, pericarditis, aortic dissection, pulmonary embolism, tension pneumothorax, and esophageal rupture.  I do not believe the patient has an emergent cause of chest pain, other urgent/non-acute considerations include, but are not limited to: chronic angina, aortic stenosis, cardiomyopathy, myocarditis, mitral valve prolapse, pulmonary hypertension, hypertrophic obstructive cardiomyopathy (HOCM), aortic  insufficiency, right ventricular hypertrophy, pneumonia, pleuritis, bronchitis, pneumothorax, tumor, gastroesophageal reflux disease (GERD), esophageal spasm, Mallory-Weiss syndrome, peptic ulcer disease, biliary disease, pancreatitis, functional gastrointestinal pain, cervical or thoracic disk disease or arthritis, shoulder arthritis, costochondritis, subacromial bursitis, anxiety or panic attack, herpes zoster, breast disorders, chest wall tumors, thoracic outlet syndrome, mediastinitis.   Co morbidities that complicate the patient evaluation   anxiety, panic disorder  My initial workup includes ACS workout  Additional history obtained from: Nursing notes from this visit.  I ordered, reviewed and interpreted labs which include:BMP, BC, troponin, urine pregnancy.  All labs within normal limits.   I ordered imaging studies including chest x-ray I independently visualized and interpreted imaging which showed normal I agree with the radiologist interpretation  Cardiac Monitoring:  The patient was maintained on a cardiac monitor.  I personally viewed and interpreted the cardiac monitored which showed an underlying rhythm of: NSR  Afebrile, hemodynamically stable.  36 year old female with a history of anxiety presenting to the ED for  evaluation of what she believes is a slight induced chest pain.  Her physical exam is completely unremarkable and patient looks overall very well.  Lab workup including troponin unremarkable.  EKG normal.  Chest x-ray normal.  Heart score 0.  Low suspicion for ACS at this time.  She states she typically takes Ativan at home for her symptoms with almost immediate resolution.  She has also had numerous upper respiratory infections recently and costochondritis is on the differential.  She was encouraged to alternate Tylenol and ibuprofen at home for her pain and to follow-up with her primary care provider regarding her anxiety.  She was given strict return precautions.  Stable at discharge.  At this time there does not appear to be any evidence of an acute emergency medical condition and the patient appears stable for discharge with appropriate outpatient follow up. Diagnosis was discussed with patient who verbalizes understanding of care plan and is agreeable to discharge. I have discussed return precautions with patient who verbalizes understanding. Patient encouraged to follow-up with their PCP within 1 week. All questions answered.  Patient's case discussed with Dr. Renaye Rakers who agrees with plan to discharge with follow-up.   Note: Portions of this report may have been transcribed using voice recognition software. Every effort was made to ensure accuracy; however, inadvertent computerized transcription errors may still be present.         Final Clinical Impression(s) / ED Diagnoses Final diagnoses:  None    Rx / DC Orders ED Discharge Orders     None         Michelle Piper, Cordelia Poche 10/10/22 1800    Terald Sleeper, MD 10/10/22 2312

## 2022-10-10 NOTE — ED Triage Notes (Signed)
Pt arrived POV. Pt caox4 and ambulatory. Pt c/o CP ongoing for approx a month but worsened last night. Pt reports hx anxiety and that she has had to take more of her anxiety meds than she usually does which she states has helped with the chest discomfort. Pt denies N/V, SOB, dizziness.

## 2022-10-19 ENCOUNTER — Other Ambulatory Visit (HOSPITAL_BASED_OUTPATIENT_CLINIC_OR_DEPARTMENT_OTHER): Payer: Self-pay | Admitting: Nurse Practitioner

## 2022-10-19 DIAGNOSIS — J301 Allergic rhinitis due to pollen: Secondary | ICD-10-CM

## 2023-03-06 ENCOUNTER — Other Ambulatory Visit: Payer: Self-pay

## 2023-03-06 ENCOUNTER — Emergency Department (HOSPITAL_BASED_OUTPATIENT_CLINIC_OR_DEPARTMENT_OTHER)
Admission: EM | Admit: 2023-03-06 | Discharge: 2023-03-06 | Disposition: A | Discharge status: Home / Self Care | Payer: 59 | Attending: Emergency Medicine | Admitting: Emergency Medicine

## 2023-03-06 ENCOUNTER — Encounter (HOSPITAL_BASED_OUTPATIENT_CLINIC_OR_DEPARTMENT_OTHER): Payer: Self-pay | Admitting: *Deleted

## 2023-03-06 DIAGNOSIS — R102 Pelvic and perineal pain: Secondary | ICD-10-CM | POA: Insufficient documentation

## 2023-03-06 LAB — CBC WITH DIFFERENTIAL/PLATELET
Abs Immature Granulocytes: 0.01 10*3/uL (ref 0.00–0.07)
Basophils Absolute: 0 10*3/uL (ref 0.0–0.1)
Basophils Relative: 0 %
Eosinophils Absolute: 0.2 10*3/uL (ref 0.0–0.5)
Eosinophils Relative: 3 %
HCT: 38.9 % (ref 36.0–46.0)
Hemoglobin: 13.3 g/dL (ref 12.0–15.0)
Immature Granulocytes: 0 %
Lymphocytes Relative: 41 %
Lymphs Abs: 3 10*3/uL (ref 0.7–4.0)
MCH: 31.4 pg (ref 26.0–34.0)
MCHC: 34.2 g/dL (ref 30.0–36.0)
MCV: 92 fL (ref 80.0–100.0)
Monocytes Absolute: 0.6 10*3/uL (ref 0.1–1.0)
Monocytes Relative: 8 %
Neutro Abs: 3.4 10*3/uL (ref 1.7–7.7)
Neutrophils Relative %: 48 %
Platelets: 292 10*3/uL (ref 150–400)
RBC: 4.23 MIL/uL (ref 3.87–5.11)
RDW: 12 % (ref 11.5–15.5)
WBC: 7.3 10*3/uL (ref 4.0–10.5)
nRBC: 0 % (ref 0.0–0.2)

## 2023-03-06 LAB — COMPREHENSIVE METABOLIC PANEL
ALT: 8 U/L (ref 0–44)
AST: 13 U/L — ABNORMAL LOW (ref 15–41)
Albumin: 4.6 g/dL (ref 3.5–5.0)
Alkaline Phosphatase: 50 U/L (ref 38–126)
Anion gap: 8 (ref 5–15)
BUN: 9 mg/dL (ref 6–20)
CO2: 27 mmol/L (ref 22–32)
Calcium: 9.7 mg/dL (ref 8.9–10.3)
Chloride: 104 mmol/L (ref 98–111)
Creatinine, Ser: 0.74 mg/dL (ref 0.44–1.00)
GFR, Estimated: >60 mL/min (ref >60)
Glucose, Bld: 71 mg/dL (ref 70–99)
Potassium: 3.5 mmol/L (ref 3.5–5.1)
Sodium: 139 mmol/L (ref 135–145)
Total Bilirubin: 0.5 mg/dL (ref 0.3–1.2)
Total Protein: 7.4 g/dL (ref 6.5–8.1)

## 2023-03-06 LAB — URINALYSIS, W/ REFLEX TO CULTURE (INFECTION SUSPECTED)
Bilirubin Urine: NEGATIVE
Glucose, UA: NEGATIVE mg/dL
Hgb urine dipstick: NEGATIVE
Ketones, ur: 15 mg/dL — AB
Leukocytes,Ua: NEGATIVE
Nitrite: NEGATIVE
Protein, ur: 30 mg/dL — AB
Specific Gravity, Urine: 1.035 — ABNORMAL HIGH (ref 1.005–1.030)
pH: 6 (ref 5.0–8.0)

## 2023-03-06 LAB — WET PREP, GENITAL
Clue Cells Wet Prep HPF POC: NONE SEEN
Sperm: NONE SEEN
Trich, Wet Prep: NONE SEEN
Yeast Wet Prep HPF POC: NONE SEEN

## 2023-03-06 LAB — PREGNANCY, URINE: Preg Test, Ur: NEGATIVE

## 2023-03-06 LAB — LIPASE, BLOOD: Lipase: 15 U/L (ref 11–51)

## 2023-03-06 MED ORDER — KETOROLAC TROMETHAMINE 15 MG/ML IJ SOLN
15.0000 mg | Freq: Once | INTRAMUSCULAR | Status: AC
  Administered 2023-03-06: 15 mg via INTRAVENOUS
  Filled 2023-03-06: qty 1

## 2023-03-06 NOTE — ED Provider Notes (Signed)
[place] EMERGENCY DEPARTMENT AT [facility] Provider Note  CSN: [id number] Arrival date & time: 03/06/23 0023  Chief Complaint(s) Vaginal Pain  HPI Tiffany Welch is a 37 y.o. female    The history is provided by the patient.  Vaginal Pain This is a recurrent problem. Episode onset: 1 week. The problem occurs constantly. The problem has not changed since onset.Associated symptoms include abdominal pain. Pertinent negatives include no chest pain, no headaches and no shortness of breath. Nothing aggravates the symptoms. Nothing relieves the symptoms. Treatments tried: Yeast cream. The treatment provided no relief.  Abdominal Pain Pain location:  Suprapubic Pain quality: aching   Pain radiates to:  Does not radiate Pain severity:  Mild Onset quality:  Gradual Duration:  1 week Timing:  Intermittent Progression:  Waxing and waning Chronicity:  Recurrent Associated symptoms: no chest pain, no constipation, no diarrhea, no dysuria, no hematuria, no melena, no nausea, no shortness of breath, no vaginal bleeding, no vaginal discharge and no vomiting    Patient is sexually active with 1 partner.  They have been together for over 3 years.  Last relation was approximately 1 month ago.  Last menstrual cycle was 423.  Past Medical History Past Medical History:  Diagnosis Date   Anxiety    Bacterial URI 07/15/2022   Seasonal allergic rhinitis due to pollen 06/22/2022   Wellness examination 06/01/2022   Patient Active Problem List   Diagnosis Date Noted   Exposure to the flu 10/06/2022   Non-recurrent acute suppurative otitis media of left ear without spontaneous rupture of tympanic membrane 10/06/2022   Allergic rhinitis 07/02/2022   Vitamin D deficiency 06/22/2022   Panic disorder 06/01/2022   Generalized anxiety disorder 06/01/2022   Home Medication(s) Prior to Admission medications   Medication Sig Start Date End Date Taking? Authorizing Provider  albuterol (VENTOLIN  HFA) 108 (90 Base) MCG/ACT inhaler Inhale 2 puffs into the lungs every 6 (six) hours as needed for wheezing or shortness of breath. 08/24/22   Birder Robson, MD  ALPRAZolam Prudy Feeler) 0.25 MG tablet Take 1/2 to 1 tab by mouth up to twice a day as needed for panic symptoms. 09/21/22   Tollie Eth, NP  amoxicillin-clavulanate (AUGMENTIN) 875-125 MG tablet Take 1 tablet by mouth 2 (two) times daily. 09/21/22   Tollie Eth, NP  azelastine (ASTELIN) 0.1 % nasal spray Place 2 sprays into both nostrils 2 (two) times daily. Use in each nostril as directed 08/24/22   Birder Robson, MD  FLOVENT HFA 110 MCG/ACT inhaler Inhale 2 puffs into the lungs in the morning and at bedtime. 08/24/22   Birder Robson, MD  fluticasone (FLONASE) 50 MCG/ACT nasal spray Place 1 spray into both nostrils daily. 08/24/22   Birder Robson, MD  levocetirizine (XYZAL) 5 MG tablet TAKE 1 TABLET(5 MG) BY MOUTH EVERY EVENING 10/19/22   Early, Sung Amabile, NP  omeprazole (PRILOSEC) 20 MG capsule Take 1 capsule (20 mg total) by mouth daily. 02/22/22   Lamptey, Britta Mccreedy, MD  oseltamivir (TAMIFLU) 75 MG capsule Take 1 capsule (75 mg total) by mouth 2 (two) times daily. 09/21/22   Tollie Eth, NP  sertraline (ZOLOFT) 50 MG tablet Take 1/2 tab for the first 4 days then increase to 1 tab by mouth at bedtime. Patient not taking: Reported on 09/21/2022 06/22/22   Tollie Eth, NP  Vitamin D, Ergocalciferol, (DRISDOL) 1.25 MG (50000 UNIT) CAPS capsule Take 1 capsule (50,000 Units total) by mouth every  7 (seven) days. 06/22/22   Tollie Eth, NP                                                                                                                                    Allergies Codeine and Doxycycline  Review of Systems Review of Systems  Respiratory:  Negative for shortness of breath.   Cardiovascular:  Negative for chest pain.  Gastrointestinal:  Positive for abdominal pain. Negative for constipation, diarrhea, melena, nausea and vomiting.   Genitourinary:  Positive for vaginal pain. Negative for dysuria, hematuria, vaginal bleeding and vaginal discharge.  Neurological:  Negative for headaches.   As noted in HPI  Physical Exam Vital Signs  I have reviewed the triage vital signs BP 120/63 (BP Location: Right Arm)   Pulse 79   Temp 98.4 F (36.9 C) (Oral)   Resp 16   LMP 02/08/2023 (Approximate)   SpO2 99%   Physical Exam Vitals reviewed. Exam conducted with a chaperone present.  Constitutional:      General: She is not in acute distress.    Appearance: She is well-developed. She is not diaphoretic.  HENT:     Head: Normocephalic and atraumatic.     Right Ear: External ear normal.     Left Ear: External ear normal.     Nose: Nose normal.  Eyes:     General: No scleral icterus.    Conjunctiva/sclera: Conjunctivae normal.  Neck:     Trachea: Phonation normal.  Cardiovascular:     Rate and Rhythm: Normal rate and regular rhythm.  Pulmonary:     Effort: Pulmonary effort is normal. No respiratory distress.     Breath sounds: No stridor.  Abdominal:     General: There is no distension.     Tenderness: There is abdominal tenderness in the suprapubic area and left lower quadrant.  Genitourinary:    Pubic Area: No rash.      Labia:        Right: No rash or lesion.        Left: No rash or lesion.      Vagina: No foreign body. No vaginal discharge, erythema, tenderness, bleeding or lesions.     Cervix: No discharge, friability, lesion, erythema or cervical bleeding.     Comments: Intravaginal cream in place Musculoskeletal:        General: Normal range of motion.     Cervical back: Normal range of motion.  Lymphadenopathy:     Lower Body: No right inguinal adenopathy. No left inguinal adenopathy.  Neurological:     Mental Status: She is alert and oriented to person, place, and time.  Psychiatric:        Behavior: Behavior normal.     ED Results and Treatments Labs (all labs ordered are listed, but only  abnormal results are displayed) Labs Reviewed  WET PREP, GENITAL - Abnormal; Notable for the following components:  Result Value   WBC, Wet Prep HPF POC FEW (*)    All other components within normal limits  URINALYSIS, W/ REFLEX TO CULTURE (INFECTION SUSPECTED) - Abnormal; Notable for the following components:   Specific Gravity, Urine 1.035 (*)    Ketones, ur 15 (*)    Protein, ur 30 (*)    Bacteria, UA RARE (*)    All other components within normal limits  COMPREHENSIVE METABOLIC PANEL - Abnormal; Notable for the following components:   AST 13 (*)    All other components within normal limits  PREGNANCY, URINE  CBC WITH DIFFERENTIAL/PLATELET  LIPASE, BLOOD  GC/CHLAMYDIA PROBE AMP ([place]) NOT AT [facility]                                                                                                                         EKG  EKG Interpretation  Date/Time:    Ventricular Rate:    PR Interval:    QRS Duration:   QT Interval:    QTC Calculation:   R Axis:     Text Interpretation:         Radiology No results found.  Medications Ordered in ED Medications  ketorolac (TORADOL) 15 MG/ML injection 15 mg (has no administration in time range)                                                                                                                                     Procedures Procedures  (including critical care time)  Medical Decision Making / ED Course  Click here for ABCD2, HEART and other calculators  Medical Decision Making Amount and/or Complexity of Data Reviewed Labs: ordered.     This patient presents to the ED for: Pelvic/vaginal pain    Presentation involves an extensive number of treatment options, and is a complaint that carries with it a high risk of complications and morbidity. The differential diagnosis includes but not limited to:  Vulvovaginitis, Vulvodynia, BV, GC/chlamydia,HSV, UTI, pregnancy related process, diverticulitis/other  intra-abdominal inflammatory/infectious process. Less concerning for torsion.  Hospitalization considered:  Yes   Work up Interpretation and Management:  Laboratory Tests ordered listed below with my independent interpretation:     Imaging Studies ordered listed below with my independent interpretation:   ED Course:   Clinical Course as of 03/06/23 0224  Sun Mar 06, 2023  0155 UA without evidence of infection  UPT negative  Wet prep without evidence of trichomonas, bacterial vaginosis or yeast infection.  Exam was not concerning for cervicitis.  GC/chlamydia pending.  No need for empiric treatment at this time.  Since GU workup was unrevealing, additional labs obtained  [PC]  0215 CBC without leukocytosis or anemia [PC]  0221 CMP without significant electrolyte derangements or renal sufficiency.  No evidence of biliary obstruction or pancreatitis.  Unlikely serious intra-abdominal inflammatory/infectious process requiring imaging at this time.  Will recommend supportive, symptomatic management and close monitoring at home. [PC]    Clinical Course User Index [PC] Alin Chavira, Amadeo Garnet, MD     Final Clinical Impression(s) / ED Diagnoses Final diagnoses:  Pelvic pain  Vaginal pain   The patient appears reasonably screened and/or stabilized for discharge and I doubt any other medical condition or other [facility] requiring further screening, evaluation, or treatment in the ED at this time. I have discussed the findings, Dx and Tx plan with the patient/family who expressed understanding and agree(s) with the plan. Discharge instructions discussed at length. The patient/family was given strict return precautions who verbalized understanding of the instructions. No further questions at time of discharge.  Disposition: Discharge  Condition: Good  ED Discharge Orders     None        Follow Up: Tollie Eth, NP [address] [place]  [address] [phone]  Call  to schedule an appointment for close follow up           This chart was dictated using voice recognition software.  Despite best efforts to proofread,  errors can occur which can change the documentation meaning.    Nira Conn, MD 03/06/23 [phone]

## 2023-03-06 NOTE — ED Triage Notes (Signed)
Pt is here for evaluation of a painful burning sensation in her vagina which began Monday.  Pt has also had burning pain in in pelvic area.  Pt has also had some urinary frequency, no change in vaginal discharge, no new sexual partners.  Pt reports hx of BV and she had leftover antibiotics for this and took some without relief (has been taking antibiotic since Tuesday)

## 2023-03-07 LAB — GC/CHLAMYDIA PROBE AMP (~~LOC~~) NOT AT ARMC
Chlamydia: NEGATIVE
Comment: NEGATIVE
Comment: NORMAL
Neisseria Gonorrhea: NEGATIVE

## 2023-03-17 ENCOUNTER — Encounter: Payer: Self-pay | Admitting: Nurse Practitioner

## 2023-05-05 IMAGING — DX DG CHEST 2V
2 series · 2 of 2 positions shown · non-contrast
Comparison: None Available.

CLINICAL DATA: Chest pain

EXAM:
CHEST - 2 VIEW

[chest pa]
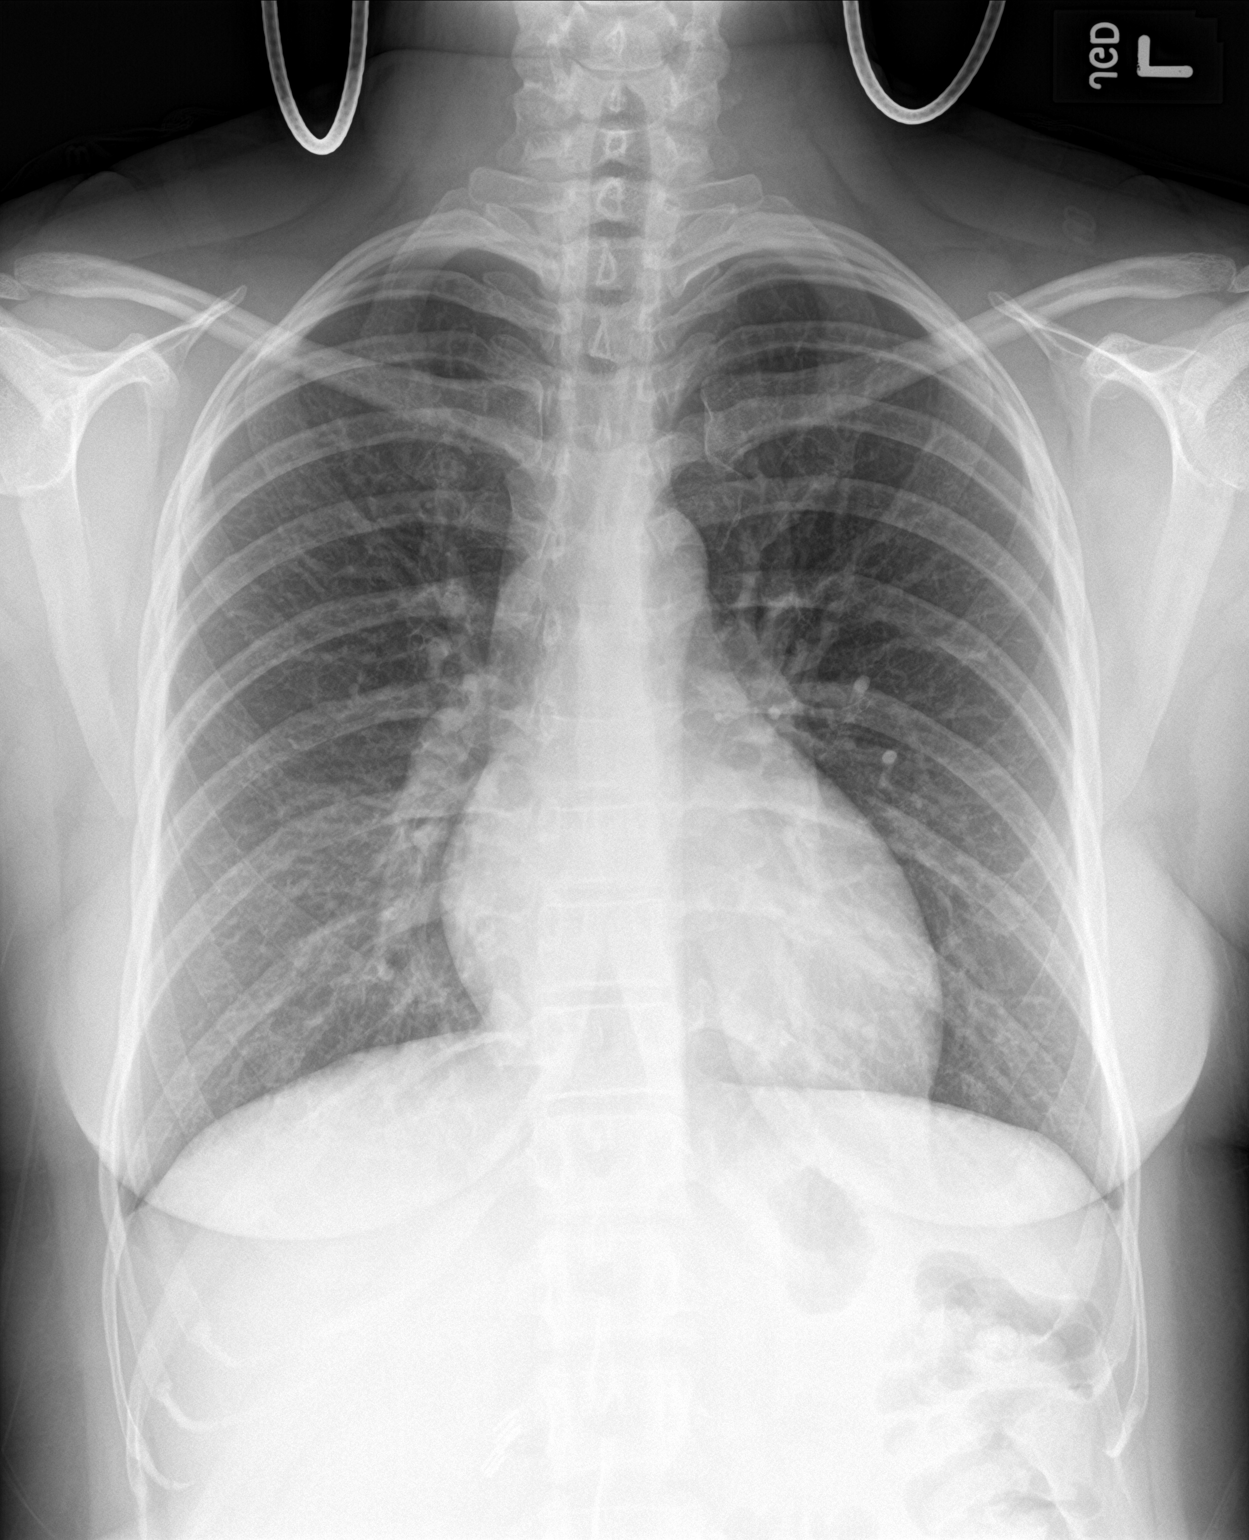

[chest lat]
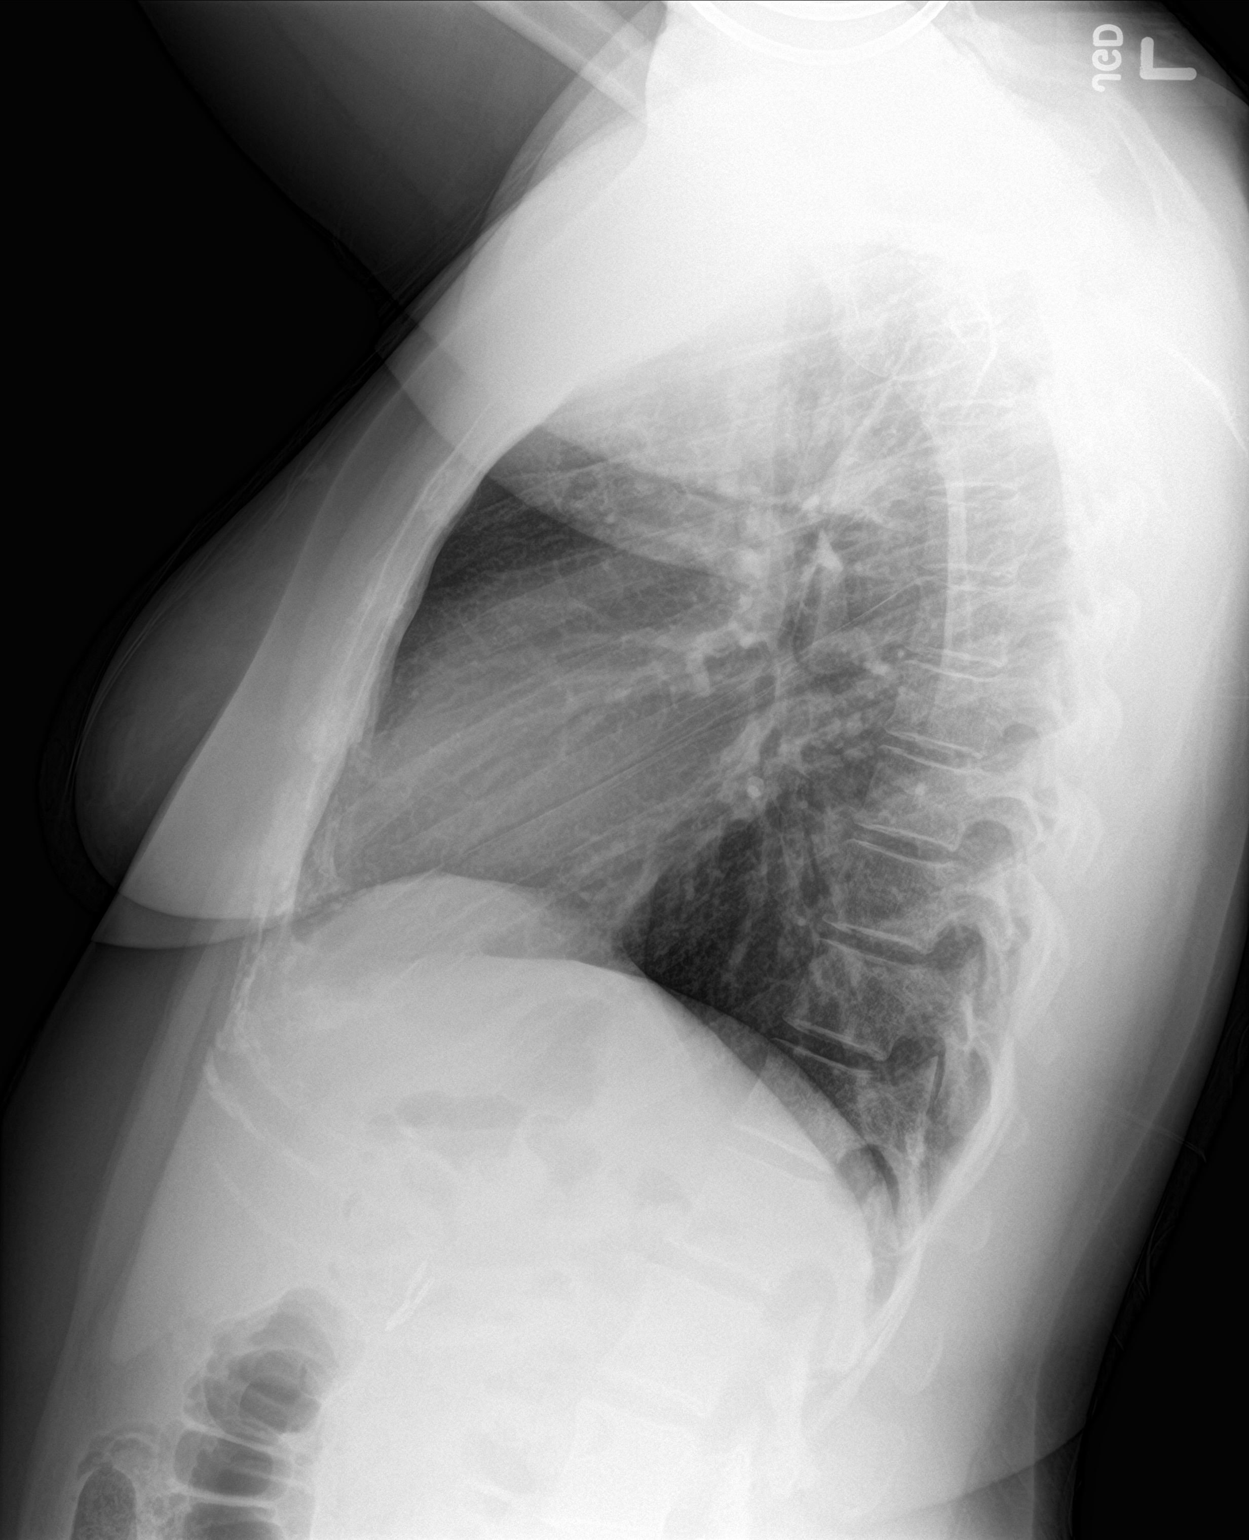

[2 of 2 positions shown; findings below may reference images not displayed]

FINDINGS: Normal heart size. Normal mediastinal contour. No pneumothorax. No
pleural effusion. Lungs appear clear, with no acute consolidative
airspace disease and no pulmonary edema. Cholecystectomy clips are
seen in the right upper quadrant of the abdomen.
IMPRESSION: No active cardiopulmonary disease.

## 2023-05-19 ENCOUNTER — Encounter (HOSPITAL_BASED_OUTPATIENT_CLINIC_OR_DEPARTMENT_OTHER): Payer: Self-pay | Admitting: Emergency Medicine

## 2023-05-19 ENCOUNTER — Other Ambulatory Visit: Payer: Self-pay

## 2023-05-19 ENCOUNTER — Emergency Department (HOSPITAL_BASED_OUTPATIENT_CLINIC_OR_DEPARTMENT_OTHER): Payer: 59

## 2023-05-19 ENCOUNTER — Emergency Department (HOSPITAL_BASED_OUTPATIENT_CLINIC_OR_DEPARTMENT_OTHER)
Admission: EM | Admit: 2023-05-19 | Discharge: 2023-05-19 | Disposition: A | Discharge status: Home / Self Care | Payer: 59 | Attending: Emergency Medicine | Admitting: Emergency Medicine

## 2023-05-19 ENCOUNTER — Emergency Department (HOSPITAL_BASED_OUTPATIENT_CLINIC_OR_DEPARTMENT_OTHER): Payer: 59 | Admitting: Radiology

## 2023-05-19 DIAGNOSIS — R1012 Left upper quadrant pain: Secondary | ICD-10-CM | POA: Insufficient documentation

## 2023-05-19 DIAGNOSIS — R0789 Other chest pain: Secondary | ICD-10-CM | POA: Insufficient documentation

## 2023-05-19 DIAGNOSIS — R079 Chest pain, unspecified: Secondary | ICD-10-CM | POA: Diagnosis not present

## 2023-05-19 DIAGNOSIS — Z1152 Encounter for screening for COVID-19: Secondary | ICD-10-CM | POA: Insufficient documentation

## 2023-05-19 LAB — HEPATIC FUNCTION PANEL
ALT: 8 U/L (ref 0–44)
AST: 14 U/L — ABNORMAL LOW (ref 15–41)
Albumin: 4 g/dL (ref 3.5–5.0)
Alkaline Phosphatase: 51 U/L (ref 38–126)
Bilirubin, Direct: 0.1 mg/dL (ref 0.0–0.2)
Indirect Bilirubin: 0.2 mg/dL — ABNORMAL LOW (ref 0.3–0.9)
Total Bilirubin: 0.3 mg/dL (ref 0.3–1.2)
Total Protein: 6.6 g/dL (ref 6.5–8.1)

## 2023-05-19 LAB — BASIC METABOLIC PANEL
Anion gap: 7 (ref 5–15)
BUN: 6 mg/dL (ref 6–20)
CO2: 28 mmol/L (ref 22–32)
Calcium: 9.3 mg/dL (ref 8.9–10.3)
Chloride: 104 mmol/L (ref 98–111)
Creatinine, Ser: 0.68 mg/dL (ref 0.44–1.00)
GFR, Estimated: >60 mL/min (ref >60)
Glucose, Bld: 94 mg/dL (ref 70–99)
Potassium: 3.7 mmol/L (ref 3.5–5.1)
Sodium: 139 mmol/L (ref 135–145)

## 2023-05-19 LAB — TROPONIN I (HIGH SENSITIVITY)
Troponin I (High Sensitivity): 3 ng/L (ref <18)
Troponin I (High Sensitivity): 3 ng/L (ref <18)

## 2023-05-19 LAB — CBC
HCT: 38.3 % (ref 36.0–46.0)
Hemoglobin: 12.7 g/dL (ref 12.0–15.0)
MCH: 31 pg (ref 26.0–34.0)
MCHC: 33.2 g/dL (ref 30.0–36.0)
MCV: 93.4 fL (ref 80.0–100.0)
Platelets: 262 10*3/uL (ref 150–400)
RBC: 4.1 MIL/uL (ref 3.87–5.11)
RDW: 12.9 % (ref 11.5–15.5)
WBC: 5.2 10*3/uL (ref 4.0–10.5)
nRBC: 0 % (ref 0.0–0.2)

## 2023-05-19 LAB — RESP PANEL BY RT-PCR (RSV, FLU A&B, COVID)  RVPGX2
Influenza A by PCR: NEGATIVE
Influenza B by PCR: NEGATIVE
Resp Syncytial Virus by PCR: NEGATIVE
SARS Coronavirus 2 by RT PCR: NEGATIVE

## 2023-05-19 LAB — LIPASE, BLOOD: Lipase: 12 U/L (ref 11–51)

## 2023-05-19 LAB — PREGNANCY, URINE: Preg Test, Ur: NEGATIVE

## 2023-05-19 MED ORDER — FAMOTIDINE IN NACL 20-0.9 MG/50ML-% IV SOLN
20.0000 mg | Freq: Once | INTRAVENOUS | Status: AC
  Administered 2023-05-19: 20 mg via INTRAVENOUS
  Filled 2023-05-19: qty 50

## 2023-05-19 MED ORDER — IOHEXOL 300 MG/ML  SOLN
100.0000 mL | Freq: Once | INTRAMUSCULAR | Status: AC | PRN
  Administered 2023-05-19: 80 mL via INTRAVENOUS

## 2023-05-19 MED ORDER — ALUM & MAG HYDROXIDE-SIMETH 200-200-20 MG/5ML PO SUSP
30.0000 mL | Freq: Once | ORAL | Status: AC
  Administered 2023-05-19: 30 mL via ORAL
  Filled 2023-05-19: qty 30

## 2023-05-19 MED ORDER — OMEPRAZOLE 20 MG PO CPDR: 20.0000 mg | DELAYED_RELEASE_CAPSULE | Freq: Every day | ORAL | 1 refills | Status: DC

## 2023-05-19 NOTE — ED Notes (Signed)
Report given to the next RN... 

## 2023-05-19 NOTE — ED Notes (Signed)
Pt aware of the need for a urine... Unable to currently provide a sample... 

## 2023-05-19 NOTE — ED Notes (Signed)
Patient transported to CT 

## 2023-05-19 NOTE — ED Provider Notes (Signed)
[place] EMERGENCY DEPARTMENT AT [facility] Provider Note   CSN: [id number] Arrival date & time: 05/19/23  1717     History  Chief Complaint  Patient presents with   Chest Pain    Tiffany Welch is a 37 y.o. female with a PMHx of anxiety who presents to the ED with concerns for left chest pain x 3 days. She notes that her left sided chest pain is a dull nagging sensation.  She notes that she feels like the chest pain goes to her left arm.  Her last episode of chest pain was yesterday.  Denies chest pain currently.  No recent fall, injury, trauma, heavy lifting at this time.  Noticed recent stressors with her job.  Denies nasal congestion, fever, rhinorrhea, sore throat, cough, shortness of breath, palpitations.  Also notes gas pains to the underside of her left breast. Has a history of GERD that was previously treated with omeprazole.  Also notes a frontal headache that is typical for her around her menstrual cycles.  She is currently on her menstrual cycle.  Denies blurred vision, diplopia. Denies PMHx of MI, DM, HTN, CAD, family history of MI in someone younger than age 71, stents. Pt denies recent travel, immobilization, surgery, estrogen use, birth control use, anticoagulant use, or PMHx of PE/DVT.    The history is provided by the patient. No language interpreter was used.       Home Medications Prior to Admission medications   Medication Sig Start Date End Date Taking? Authorizing Provider  albuterol (VENTOLIN HFA) 108 (90 Base) MCG/ACT inhaler Inhale 2 puffs into the lungs every 6 (six) hours as needed for wheezing or shortness of breath. 08/24/22   Birder Robson, MD  ALPRAZolam Prudy Feeler) 0.25 MG tablet Take 1/2 to 1 tab by mouth up to twice a day as needed for panic symptoms. 09/21/22   Tollie Eth, NP  amoxicillin-clavulanate (AUGMENTIN) 875-125 MG tablet Take 1 tablet by mouth 2 (two) times daily. 09/21/22   Tollie Eth, NP  azelastine (ASTELIN) 0.1 % nasal spray Place  2 sprays into both nostrils 2 (two) times daily. Use in each nostril as directed 08/24/22   Birder Robson, MD  FLOVENT HFA 110 MCG/ACT inhaler Inhale 2 puffs into the lungs in the morning and at bedtime. 08/24/22   Birder Robson, MD  fluticasone (FLONASE) 50 MCG/ACT nasal spray Place 1 spray into both nostrils daily. 08/24/22   Birder Robson, MD  levocetirizine (XYZAL) 5 MG tablet TAKE 1 TABLET(5 MG) BY MOUTH EVERY EVENING 10/19/22   Early, Sung Amabile, NP  omeprazole (PRILOSEC) 20 MG capsule Take 1 capsule (20 mg total) by mouth daily. 05/19/23   ,  A, PA-C  oseltamivir (TAMIFLU) 75 MG capsule Take 1 capsule (75 mg total) by mouth 2 (two) times daily. 09/21/22   Tollie Eth, NP  sertraline (ZOLOFT) 50 MG tablet Take 1/2 tab for the first 4 days then increase to 1 tab by mouth at bedtime. Patient not taking: Reported on 09/21/2022 06/22/22   Early, Sung Amabile, NP  Vitamin D, Ergocalciferol, (DRISDOL) 1.25 MG (50000 UNIT) CAPS capsule Take 1 capsule (50,000 Units total) by mouth every 7 (seven) days. 06/22/22   Tollie Eth, NP      Allergies    Codeine and Doxycycline    Review of Systems   Review of Systems  Constitutional:  Negative for fever.  HENT:  Negative for congestion, rhinorrhea and sore throat.  Respiratory:  Negative for cough and shortness of breath.   Cardiovascular:  Positive for chest pain. Negative for palpitations.  All other systems reviewed and are negative.   Physical Exam Updated Vital Signs BP 115/71 (BP Location: Right Arm)   Pulse (!) 54   Temp 98.3 F (36.8 C)   Resp 17   Ht 5\' 5"  (1.651 m)   Wt 87.9 kg   LMP 05/16/2023   SpO2 100%   BMI 32.25 kg/m  Physical Exam  ED Results / Procedures / Treatments   Labs (all labs ordered are listed, but only abnormal results are displayed) Labs Reviewed  HEPATIC FUNCTION PANEL - Abnormal; Notable for the following components:      Result Value   AST 14 (*)    Indirect Bilirubin 0.2 (*)    All other components  within normal limits  RESP PANEL BY RT-PCR (RSV, FLU A&B, COVID)  RVPGX2  BASIC METABOLIC PANEL  CBC  PREGNANCY, URINE  LIPASE, BLOOD  TROPONIN I (HIGH SENSITIVITY)  TROPONIN I (HIGH SENSITIVITY)    EKG EKG Interpretation Date/Time:  Thursday May 19 2023 17:24:21 EDT Ventricular Rate:  70 PR Interval:  132 QRS Duration:  80 QT Interval:  402 QTC Calculation: 434 R Axis:   45  Text Interpretation: Normal sinus rhythm with sinus arrhythmia Normal ECG When compared with ECG of 10-Oct-2022 14:13, No significant change was found when compared to prior, similar appearance. no STEMI Confirmed by Theda Belfast ([id number]) on 05/19/2023 6:32:19 PM  Radiology CT ABDOMEN PELVIS W CONTRAST  Result Date: 05/19/2023 CLINICAL DATA:  Left upper quadrant pain EXAM: CT ABDOMEN AND PELVIS WITH CONTRAST TECHNIQUE: Multidetector CT imaging of the abdomen and pelvis was performed using the standard protocol following bolus administration of intravenous contrast. RADIATION DOSE REDUCTION: This exam was performed according to the departmental dose-optimization program which includes automated exposure control, adjustment of the mA and/or kV according to patient size and/or use of iterative reconstruction technique. CONTRAST:  80mL OMNIPAQUE IOHEXOL 300 MG/ML  SOLN COMPARISON:  None Available. FINDINGS: Lower chest: No acute abnormality. Hepatobiliary: No focal liver abnormality is seen. Status post cholecystectomy. No biliary dilatation. Pancreas: Unremarkable. No pancreatic ductal dilatation or surrounding inflammatory changes. Spleen: Normal in size without focal abnormality. Adrenals/Urinary Tract: Adrenal glands are unremarkable. Kidneys are normal, without renal calculi, focal lesion, or hydronephrosis. Bladder is unremarkable. Stomach/Bowel: Stomach is within normal limits. Appendix appears normal. No evidence of bowel wall thickening, distention, or inflammatory changes. Vascular/Lymphatic: No significant  vascular findings are present. No enlarged abdominal or pelvic lymph nodes. Reproductive: Uterus and bilateral adnexa are unremarkable. There surgical clips in the bilateral adnexa. Other: No abdominal wall hernia or abnormality. No abdominopelvic ascites. Musculoskeletal: No acute or significant osseous findings. IMPRESSION: 1. No CT evidence of acute abdominal/pelvic process. 2. Status post cholecystectomy. Electronically Signed   By: Darliss Cheney M.D.   On: 05/19/2023 22:20   DG Chest 2 View  Result Date: 05/19/2023 CLINICAL DATA:  Chest pain EXAM: CHEST - 2 VIEW COMPARISON:  X-ray 10/10/2022 FINDINGS: No consolidation, pneumothorax or effusion. No edema. Normal cardiopericardial silhouette. Surgical changes in the right upper quadrant. IMPRESSION: No acute cardiopulmonary disease Electronically Signed   By: Karen Kays M.D.   On: 05/19/2023 18:05    Procedures Procedures    Medications Ordered in ED Medications  alum & mag hydroxide-simeth (MAALOX/MYLANTA) 200-200-20 MG/5ML suspension 30 mL (30 mLs Oral Given 05/19/23 2021)  famotidine (PEPCID) IVPB 20 mg premix (0 mg Intravenous  Stopped 05/19/23 2100)  iohexol (OMNIPAQUE) 300 MG/ML solution 100 mL (80 mLs Intravenous Contrast Given 05/19/23 2200)    ED Course/ Medical Decision Making/ A&P Clinical Course as of 05/20/23 2031  Thu May 19, 2023  2248 Re-evaluated and noted improvement of symptoms with treatment regimen. Discussed discharge treatment plan. Pt agreeable at this time. Pt appears safe for discharge. [SB]    Clinical Course User Index [SB] ,  A, PA-C                                 Medical Decision Making Amount and/or Complexity of Data Reviewed Labs: ordered. Radiology: ordered.  Risk OTC drugs. Prescription drug management.   Patient presents to the ED with chest pain x 3 days.  Denies chest pain at this time.  Last episode of chest pain was yesterday.  No previous cardiac or PE risk factors.  Patient  afebrile, not tachycardic or hypoxic.  On exam patient without chest wall tenderness to palpation.  Tenderness to palpation noted to left upper quadrant.  Otherwise no acute cardiovascular, respiratory, abdominal sign findings.  Differential diagnosis includes ACS, pneumonia, pneumothorax, PE, pancreatitis, diverticulitis, GERD.   Labs:  I ordered, and personally interpreted labs.  The pertinent results include:   Initial and delta troponin at 3 Negative COVID, flu, RSV swab Lipase unremarkable BMP, CBC unremarkable Hepatic function panel unremarkable Negative pregnancy urine  Imaging: I ordered imaging studies including CXR, CT AP I independently visualized and interpreted imaging which showed: No acute findings noted on x-ray or CT imaging. I agree with the radiologist interpretation  Medications:  I ordered medication including Pepcid, GI cocktail for symptom management Reevaluation of the patient after these medicines and interventions, I reevaluated the patient and found that they have improved I have reviewed the patients home medicines and have made adjustments as needed   Disposition: Presentation suspicious for atypical chest wall pain.  Doubt concerns at this time for ACS, pneumonia, pneumothorax.  Low suspicion at this time for COVID, flu, RSV.  PERC negative.  Doubt concerns at this time for pancreatitis, diverticulitis.  No chest pain during ED visit. Likely GERD along with atypical chest pain. After consideration of the diagnostic results and the patients response to treatment, I feel that the patient would benefit from Discharge home.  Ambulatory referral to cardiology provided today.  Prescription for omeprazole given today.  Supportive care measures and strict return precautions discussed with patient at bedside. Pt acknowledges and verbalizes understanding. Pt appears safe for discharge. Follow up as indicated in discharge paperwork.   This chart was dictated using voice  recognition software, Dragon. Despite the best efforts of this provider to proofread and correct errors, errors may still occur which can change documentation meaning.   Final Clinical Impression(s) / ED Diagnoses Final diagnoses:  Chest wall pain  LUQ pain    Rx / DC Orders ED Discharge Orders          Ordered    omeprazole (PRILOSEC) 20 MG capsule  Daily        05/19/23 2250    Ambulatory referral to Cardiology       Comments: If you have not heard from the Cardiology office within the next 72 hours please call [phone].   05/19/23 [address]  A, PA-C 05/20/23 2031    Tegeler, Canary Brim, MD  05/20/23 2208  

## 2023-05-19 NOTE — ED Triage Notes (Signed)
Patient reports chest pain radiating into left arm and shoulder and "gas" x 3 days.

## 2023-05-19 NOTE — ED Notes (Signed)
Reviewed AVS/discharge instruction with patient. Time allotted for and all questions answered. Patient is agreeable for d/c and escorted to ed exit by staff.  

## 2023-05-19 NOTE — Discharge Instructions (Signed)
It was a pleasure taking care of you today!   Your workup was negative in the ED. Your CT didn't show any concerning emergent findings today.  You will be sent a prescription for omeprazole, take as directed.  Attached is information for the on-call GI specialist for follow-up regarding today's ED visit.  You be given ambulatory referral to cardiology to follow-up as needed regarding today's ED visit.  You may apply ice or heat to the affected area for 15 minutes at a time.  Ensure to place a barrier between your skin and and the ice.  You may use over-the-counter 1,000 mg Tylenol every 6 hours or 600 mg ibuprofen every 6 hours as needed for pain.  Follow-up with your primary care provider for evaluation of your symptoms. If you do not have a primary care provider, you may follow-up with the [facility] department as needed.  You may return to the ED if you are experiencing increasing/worsening chest pain, shortness of breath, or worsening symptoms.

## 2023-06-13 ENCOUNTER — Encounter (HOSPITAL_BASED_OUTPATIENT_CLINIC_OR_DEPARTMENT_OTHER): Payer: Self-pay | Admitting: *Deleted

## 2023-06-13 ENCOUNTER — Emergency Department (HOSPITAL_BASED_OUTPATIENT_CLINIC_OR_DEPARTMENT_OTHER): Payer: 59

## 2023-06-13 ENCOUNTER — Other Ambulatory Visit: Payer: Self-pay

## 2023-06-13 ENCOUNTER — Emergency Department (HOSPITAL_BASED_OUTPATIENT_CLINIC_OR_DEPARTMENT_OTHER)
Admission: EM | Admit: 2023-06-13 | Discharge: 2023-06-13 | Disposition: A | Discharge status: Home / Self Care | Payer: 59 | Attending: Emergency Medicine | Admitting: Emergency Medicine

## 2023-06-13 DIAGNOSIS — R051 Acute cough: Secondary | ICD-10-CM | POA: Diagnosis not present

## 2023-06-13 DIAGNOSIS — R0602 Shortness of breath: Secondary | ICD-10-CM | POA: Insufficient documentation

## 2023-06-13 DIAGNOSIS — Z1152 Encounter for screening for COVID-19: Secondary | ICD-10-CM | POA: Diagnosis not present

## 2023-06-13 DIAGNOSIS — R059 Cough, unspecified: Secondary | ICD-10-CM | POA: Diagnosis not present

## 2023-06-13 DIAGNOSIS — I10 Essential (primary) hypertension: Secondary | ICD-10-CM | POA: Diagnosis not present

## 2023-06-13 DIAGNOSIS — R457 State of emotional shock and stress, unspecified: Secondary | ICD-10-CM | POA: Diagnosis not present

## 2023-06-13 LAB — RESP PANEL BY RT-PCR (RSV, FLU A&B, COVID)  RVPGX2
Influenza A by PCR: NEGATIVE
Influenza B by PCR: NEGATIVE
Resp Syncytial Virus by PCR: NEGATIVE
SARS Coronavirus 2 by RT PCR: NEGATIVE

## 2023-06-13 LAB — CBC
HCT: 37.8 % (ref 36.0–46.0)
Hemoglobin: 12.8 g/dL (ref 12.0–15.0)
MCH: 31.1 pg (ref 26.0–34.0)
MCHC: 33.9 g/dL (ref 30.0–36.0)
MCV: 91.7 fL (ref 80.0–100.0)
Platelets: 281 10*3/uL (ref 150–400)
RBC: 4.12 MIL/uL (ref 3.87–5.11)
RDW: 12.6 % (ref 11.5–15.5)
WBC: 7.1 10*3/uL (ref 4.0–10.5)
nRBC: 0 % (ref 0.0–0.2)

## 2023-06-13 LAB — BASIC METABOLIC PANEL
Anion gap: 10 (ref 5–15)
BUN: 8 mg/dL (ref 6–20)
CO2: 23 mmol/L (ref 22–32)
Calcium: 9.3 mg/dL (ref 8.9–10.3)
Chloride: 104 mmol/L (ref 98–111)
Creatinine, Ser: 0.79 mg/dL (ref 0.44–1.00)
GFR, Estimated: >60 mL/min (ref >60)
Glucose, Bld: 113 mg/dL — ABNORMAL HIGH (ref 70–99)
Potassium: 3.7 mmol/L (ref 3.5–5.1)
Sodium: 137 mmol/L (ref 135–145)

## 2023-06-13 LAB — TROPONIN I (HIGH SENSITIVITY)
Troponin I (High Sensitivity): 3 ng/L (ref <18)
Troponin I (High Sensitivity): 3 ng/L (ref <18)

## 2023-06-13 LAB — D-DIMER, QUANTITATIVE: D-Dimer, Quant: 0.35 ug{FEU}/mL (ref 0.00–0.50)

## 2023-06-13 LAB — PREGNANCY, URINE: Preg Test, Ur: NEGATIVE

## 2023-06-13 MED ORDER — DIPHENHYDRAMINE HCL 25 MG PO CAPS
25.0000 mg | ORAL_CAPSULE | Freq: Once | ORAL | Status: AC
  Administered 2023-06-13: 25 mg via ORAL
  Filled 2023-06-13: qty 1

## 2023-06-13 MED ORDER — SODIUM CHLORIDE 0.9 % IV BOLUS
500.0000 mL | Freq: Once | INTRAVENOUS | Status: AC
  Administered 2023-06-13: 500 mL via INTRAVENOUS

## 2023-06-13 MED ORDER — ALUM & MAG HYDROXIDE-SIMETH 200-200-20 MG/5ML PO SUSP
15.0000 mL | Freq: Once | ORAL | Status: AC
  Administered 2023-06-13: 15 mL via ORAL
  Filled 2023-06-13: qty 30

## 2023-06-13 MED ORDER — FAMOTIDINE 20 MG PO TABS
20.0000 mg | ORAL_TABLET | Freq: Once | ORAL | Status: AC
  Administered 2023-06-13: 20 mg via ORAL
  Filled 2023-06-13: qty 1

## 2023-06-13 NOTE — ED Notes (Signed)
Patient ambulated to bathroom with no distress. HR 64-66 and O2 sat range btn 98-100%.

## 2023-06-13 NOTE — ED Notes (Signed)
Patient ambulated to bathroom with no difficulties. HR 64-66 and O2 sats 98-100

## 2023-06-13 NOTE — Discharge Instructions (Addendum)
I am glad you are feeling better.  Workup today was reassuring.  Please follow-up with your primary care provider in the next couple of days.  Seek emergency care if experiencing any new or worsening symptoms.

## 2023-06-13 NOTE — ED Provider Notes (Signed)
[place] [facility]PARTMENT AT [facility] Provider Note   CSN: [id number] Arrival date & time: 06/13/23  1636     History  Chief Complaint  Patient presents with   Cough    Tiffany Welch is a 37 y.o. female with PMHx seasonal allergies, anxiety/panic disorder who presents to ED concerned for sudden onset of productive cough (yellow sputum) and "a tickle" in her throat - symptoms then progressed to SOB and chest pressure/tightness. No sick contacts. No exposure to any known allergens.   Denies fever, nausea, vomiting, diarrhea. Denies recent surgery/immobilization, hx DT/PE, hemoptysis, hx cancer in the past 6 months, calf swelling/tenderness.    Cough      Home Medications Prior to Admission medications   Medication Sig Start Date End Date Taking? Authorizing Provider  ALPRAZolam Prudy Feeler) 0.25 MG tablet Take 1/2 to 1 tab by mouth up to twice a day as needed for panic symptoms. 09/21/22   Tollie Eth, NP  omeprazole (PRILOSEC) 20 MG capsule Take 1 capsule (20 mg total) by mouth daily. 05/19/23   Blue, Soijett A, PA-C  sertraline (ZOLOFT) 50 MG tablet Take 1/2 tab for the first 4 days then increase to 1 tab by mouth at bedtime. Patient not taking: Reported on 09/21/2022 06/22/22   Early, Sung Amabile, NP  Vitamin D, Ergocalciferol, (DRISDOL) 1.25 MG (50000 UNIT) CAPS capsule Take 1 capsule (50,000 Units total) by mouth every 7 (seven) days. 06/22/22   Tollie Eth, NP      Allergies    Codeine and Doxycycline    Review of Systems   Review of Systems  Respiratory:  Positive for cough.     Physical Exam Updated Vital Signs BP 107/81   Pulse 68   Temp 97.9 F (36.6 C) (Oral)   Resp 15   Ht 5\' 5"  (1.651 m)   Wt 87.5 kg   LMP 05/16/2023   SpO2 100%   BMI 32.12 kg/m  Physical Exam Vitals and nursing note reviewed.  Constitutional:      General: She is not in acute distress.    Appearance: She is not ill-appearing or toxic-appearing.  HENT:     Head:  Normocephalic and atraumatic.     Mouth/Throat:     Mouth: Mucous membranes are moist.     Pharynx: Oropharynx is clear.     Comments: No oropharyngeal swelling or lesions Eyes:     General: No scleral icterus.       Right eye: No discharge.        Left eye: No discharge.     Conjunctiva/sclera: Conjunctivae normal.  Cardiovascular:     Rate and Rhythm: Normal rate and regular rhythm.     Pulses: Normal pulses.     Heart sounds: Normal heart sounds. No murmur heard. Pulmonary:     Effort: Pulmonary effort is normal. No respiratory distress.     Breath sounds: Normal breath sounds. No wheezing, rhonchi or rales.  Abdominal:     General: Abdomen is flat. Bowel sounds are normal.     Tenderness: There is no abdominal tenderness.  Musculoskeletal:     Right lower leg: No edema.     Left lower leg: No edema.     Comments: No calf tenderness to palpation  Skin:    General: Skin is warm and dry.     Findings: No rash.  Neurological:     General: No focal deficit present.     Mental Status: She is alert.  Mental status is at baseline.  Psychiatric:        Mood and Affect: Mood normal.        Behavior: Behavior normal.     ED Results / Procedures / Treatments   Labs (all labs ordered are listed, but only abnormal results are displayed) Labs Reviewed  BASIC METABOLIC PANEL - Abnormal; Notable for the following components:      Result Value   Glucose, Bld 113 (*)    All other components within normal limits  RESP PANEL BY RT-PCR (RSV, FLU A&B, COVID)  RVPGX2  CBC  PREGNANCY, URINE  D-DIMER, QUANTITATIVE  TROPONIN I (HIGH SENSITIVITY)  TROPONIN I (HIGH SENSITIVITY)    EKG None  Radiology DG Chest 2 View  Result Date: 06/13/2023 CLINICAL DATA:  Shortness of breath and cough. EXAM: CHEST - 2 VIEW COMPARISON:  May 19, 2023 FINDINGS: The heart size and mediastinal contours are within normal limits. Both lungs are clear. Radiopaque surgical clips are seen within the abdomen  on the lateral view. The visualized skeletal structures are unremarkable. IMPRESSION: No active cardiopulmonary disease. Electronically Signed   By: Aram Candela M.D.   On: 06/13/2023 17:48    Procedures Procedures    Medications Ordered in ED Medications  sodium chloride 0.9 % bolus 500 mL (0 mLs Intravenous Stopped 06/13/23 2115)  diphenhydrAMINE (BENADRYL) capsule 25 mg (25 mg Oral Given 06/13/23 1955)  alum & mag hydroxide-simeth (MAALOX/MYLANTA) 200-200-20 MG/5ML suspension 15 mL (15 mLs Oral Given 06/13/23 2113)  famotidine (PEPCID) tablet 20 mg (20 mg Oral Given 06/13/23 2114)    ED Course/ Medical Decision Making/ A&P                                 Medical Decision Making Amount and/or Complexity of Data Reviewed Labs: ordered. Radiology: ordered.   This patient presents to the ED for concern of shortness of breath, this involves an extensive number of treatment options, and is a complaint that carries with it a high risk of complications and morbidity.  The differential diagnosis includes Anxiety, Anaphylaxis/Angioedema, Aspirated FB, Arrhythmia, CHF, Asthma, COPD, PNA, COVID/Flu/RSV, STEMI, Tamponade, TPNX, Sepsis   Co morbidities that complicate the patient evaluation  seasonal allergies, anxiety   Lab Tests:  I Ordered, and personally interpreted labs.  The pertinent results include:   - BMP: no concern for electrolyte abnormality; no concern for kidney damage - Trop: within normal limits - CBC: No concern for anemia or leukocytosis - D-Dimer: within normal limits - Respiratory Panel: negative - UPT: negative   Imaging Studies ordered:  I ordered imaging studies including  -chest xray: To assess for process contributing to patient's symptoms I independently visualized and interpreted imaging  I agree with the radiologist interpretation   Cardiac Monitoring: / EKG:  The patient was maintained on a cardiac monitor.  I personally viewed and interpreted  the cardiac monitored which showed an underlying rhythm of: Sinus rhythm without acute ST changes or arrhythmias    Problem List / ED Course / Critical interventions / Medication management  Patient presents to ED concern for shortness of breath and chest tightness.  Patient stating that symptoms initially started as a tickle in her throat and cough while she was at work. Patient stating that she has panic disorder but states that these symptoms feel different. Patient afebrile with stable vitals.  During initial interview, patient seemed to be breathing deeply while  speaking in full sentences. Otherwise, physical exam was unremarkable. Patient ambulated with HR in 60's and O2 sat >98%. Initial and repeat troponin within normal limits.  UPT negative.  D-dimer within normal limits.  CBC without leukocytosis or anemia.  Respiratory panel negative.  BMP without electrolyte abnormalities.  BUN/Cr within normal limits.  Chest x-ray without acute cardiopulmonary disease.  EKG reassuring. Provided patient with a dose of Benadryl since she stated that she has a history of environmental allergies. Also provided patient GI cocktail to see if symptoms were related to acid reflux. Patient stating that her symptoms were relieved with the medication provided. Education patient that she will need to follow up with PCP later this week. Patient verbalized understanding of plan. I have reviewed the patients home medicines and have made adjustments as needed Patient was given return precautions. Patient stable for discharge at this time. Patient verbalized understanding of plan.  DDx: These are considered less likely due to history of present illness and physical exam findings Anaphylaxis/Angioedema: physical exam reassuring Aspirated FB: no history of choking Arrhythmia/STEMI: EKG without concern Asthma/COPD/PNA/COVID/Flu/RSV: Lungs clear to auscultation bilaterally and O2 sat 100% on RA CHF: no physical exam  findings TPNX: Lungs clear to auscultation bilaterally Sepsis: afebrile and other vital signs stable  Risk Stratification Score:  Wells: 0   Social Determinants of Health:  none          Final Clinical Impression(s) / ED Diagnoses Final diagnoses:  Acute cough  SOB (shortness of breath)    Rx / DC Orders ED Discharge Orders     None         Margarita Rana 06/13/23 2139    Benjiman Core, MD 06/13/23 2316

## 2023-06-13 NOTE — ED Triage Notes (Signed)
Patient presents to ED via [facility] from work. Here with cough and "a tickle" in her throat. Sudden onset. Reports feeling fine this morning.

## 2023-06-26 ENCOUNTER — Encounter (HOSPITAL_BASED_OUTPATIENT_CLINIC_OR_DEPARTMENT_OTHER): Payer: Self-pay

## 2023-06-26 ENCOUNTER — Other Ambulatory Visit: Payer: Self-pay

## 2023-06-26 ENCOUNTER — Emergency Department (HOSPITAL_BASED_OUTPATIENT_CLINIC_OR_DEPARTMENT_OTHER)
Admission: EM | Admit: 2023-06-26 | Discharge: 2023-06-26 | Disposition: A | Discharge status: Home / Self Care | Payer: 59 | Attending: Emergency Medicine | Admitting: Emergency Medicine

## 2023-06-26 ENCOUNTER — Emergency Department (HOSPITAL_BASED_OUTPATIENT_CLINIC_OR_DEPARTMENT_OTHER): Payer: 59 | Admitting: Radiology

## 2023-06-26 DIAGNOSIS — R0602 Shortness of breath: Secondary | ICD-10-CM | POA: Diagnosis not present

## 2023-06-26 DIAGNOSIS — R059 Cough, unspecified: Secondary | ICD-10-CM | POA: Insufficient documentation

## 2023-06-26 DIAGNOSIS — Z20822 Contact with and (suspected) exposure to covid-19: Secondary | ICD-10-CM | POA: Diagnosis not present

## 2023-06-26 LAB — SARS CORONAVIRUS 2 BY RT PCR: SARS Coronavirus 2 by RT PCR: NEGATIVE

## 2023-06-26 MED ORDER — DEXAMETHASONE 4 MG PO TABS
10.0000 mg | ORAL_TABLET | Freq: Once | ORAL | Status: AC
  Administered 2023-06-26: 10 mg via ORAL
  Filled 2023-06-26: qty 3

## 2023-06-26 MED ORDER — HYDROCOD POLI-CHLORPHE POLI ER 10-8 MG/5ML PO SUER: 5.0000 mL | Freq: Two times a day (BID) | ORAL | 0 refills | Status: DC | PRN

## 2023-06-26 MED ORDER — AEROCHAMBER PLUS FLO-VU MISC
1.0000 | Freq: Once | Status: AC
  Administered 2023-06-26: 1
  Filled 2023-06-26: qty 1

## 2023-06-26 MED ORDER — ALBUTEROL SULFATE HFA 108 (90 BASE) MCG/ACT IN AERS
2.0000 | INHALATION_SPRAY | Freq: Once | RESPIRATORY_TRACT | Status: AC
  Administered 2023-06-26: 2 via RESPIRATORY_TRACT
  Filled 2023-06-26: qty 6.7

## 2023-06-26 NOTE — ED Notes (Signed)
RT educated pt on proper use of MDI w/spacer. Pt able to perform without difficulty. Pt verbalizes understanding.  ?

## 2023-06-26 NOTE — Discharge Instructions (Signed)
Take tylenol 2 pills 4 times a day and motrin 4 pills 3 times a day.  Drink plenty of fluids.  Return for worsening shortness of breath, headache, confusion. Follow up with your family doctor.   

## 2023-06-26 NOTE — ED Provider Notes (Signed)
[place] EMERGENCY DEPARTMENT AT [facility] Provider Note   CSN: [id number] Arrival date & time: 06/26/23  2135     History  Chief Complaint  Patient presents with   Cough    Tiffany Welch is a 37 y.o. female.  37 yo F with a chief complaints of a persistent cough.  Patient has had symptoms for couple weeks.  She feels like her cough is unrelenting.  Nothing seems to make it better or worse.  She was seen in the emergency department at the onset of her symptoms.  Had blood work and chest x-ray that were unremarkable.  COVID and flu testing were negative that time as well.   Cough      Home Medications Prior to Admission medications   Medication Sig Start Date End Date Taking? Authorizing Provider  chlorpheniramine-HYDROcodone (TUSSIONEX) 10-8 MG/5ML Take 5 mLs by mouth every 12 (twelve) hours as needed for cough. 06/26/23  Yes Melene Plan, DO  ALPRAZolam Prudy Feeler) 0.25 MG tablet Take 1/2 to 1 tab by mouth up to twice a day as needed for panic symptoms. 09/21/22   Tollie Eth, NP  omeprazole (PRILOSEC) 20 MG capsule Take 1 capsule (20 mg total) by mouth daily. 05/19/23   Blue, Soijett A, PA-C  sertraline (ZOLOFT) 50 MG tablet Take 1/2 tab for the first 4 days then increase to 1 tab by mouth at bedtime. Patient not taking: Reported on 09/21/2022 06/22/22   Early, Sung Amabile, NP  Vitamin D, Ergocalciferol, (DRISDOL) 1.25 MG (50000 UNIT) CAPS capsule Take 1 capsule (50,000 Units total) by mouth every 7 (seven) days. 06/22/22   Tollie Eth, NP      Allergies    Codeine and Doxycycline    Review of Systems   Review of Systems  Respiratory:  Positive for cough.     Physical Exam Updated Vital Signs BP 110/72 (BP Location: Right Arm)   Pulse 90   Temp 98.2 F (36.8 C)   Resp 20   Ht 5\' 4"  (1.626 m)   Wt 87.5 kg   LMP 06/11/2023   SpO2 96%   BMI 33.13 kg/m  Physical Exam Vitals and nursing note reviewed.  Constitutional:      General: She is not in acute distress.     Appearance: She is well-developed. She is not diaphoretic.  HENT:     Head: Normocephalic and atraumatic.     Comments: Swollen turbinates, posterior nasal drip,   Eyes:     Pupils: Pupils are equal, round, and reactive to light.  Cardiovascular:     Rate and Rhythm: Normal rate and regular rhythm.     Heart sounds: No murmur heard.    No friction rub. No gallop.  Pulmonary:     Effort: Pulmonary effort is normal.     Breath sounds: No wheezing or rales.  Abdominal:     General: There is no distension.     Palpations: Abdomen is soft.     Tenderness: There is no abdominal tenderness.  Musculoskeletal:        General: No tenderness.     Cervical back: Normal range of motion and neck supple.  Skin:    General: Skin is warm and dry.  Neurological:     Mental Status: She is alert and oriented to person, place, and time.  Psychiatric:        Behavior: Behavior normal.     ED Results / Procedures / Treatments   Labs (all labs  ordered are listed, but only abnormal results are displayed) Labs Reviewed  SARS CORONAVIRUS 2 BY RT PCR    EKG None  Radiology DG Chest 2 View  Result Date: 06/26/2023 CLINICAL DATA:  Shortness of breath EXAM: CHEST - 2 VIEW COMPARISON:  Chest x-ray 06/13/2023 FINDINGS: The heart size and mediastinal contours are within normal limits. Both lungs are clear. The visualized skeletal structures are unremarkable. IMPRESSION: No active cardiopulmonary disease. Electronically Signed   By: Darliss Cheney M.D.   On: 06/26/2023 22:48    Procedures Procedures    Medications Ordered in ED Medications  albuterol (VENTOLIN HFA) 108 (90 Base) MCG/ACT inhaler 2 puff (has no administration in time range)  aerochamber plus with mask device 1 each (has no administration in time range)  dexamethasone (DECADRON) tablet 10 mg (10 mg Oral Given 06/26/23 2239)    ED Course/ Medical Decision Making/ A&P                                 Medical Decision Making Amount  and/or Complexity of Data Reviewed Radiology: ordered.  Risk Prescription drug management.   82 yoF with a chief complaints of cough.  This been going on for couple weeks.  She has swollen turbinates and posterior nasal drip.  Clear lung sounds for me.  Trial of 2 puffs albuterol.  Steroids.  Chest x-ray independently interpreted by me without focal infiltrate or pneumothorax.  10:53 PM:  I have discussed the diagnosis/risks/treatment options with the patient.  Evaluation and diagnostic testing in the emergency department does not suggest an emergent condition requiring admission or immediate intervention beyond what has been performed at this time.  They will follow up with PCP. We also discussed returning to the ED immediately if new or worsening sx occur. We discussed the sx which are most concerning (e.g., sudden worsening pain, fever, inability to tolerate by mouth) that necessitate immediate return. Medications administered to the patient during their visit and any new prescriptions provided to the patient are listed below.  Medications given during this visit Medications  albuterol (VENTOLIN HFA) 108 (90 Base) MCG/ACT inhaler 2 puff (has no administration in time range)  aerochamber plus with mask device 1 each (has no administration in time range)  dexamethasone (DECADRON) tablet 10 mg (10 mg Oral Given 06/26/23 2239)     The patient appears reasonably screen and/or stabilized for discharge and I doubt any other medical condition or other [facility] requiring further screening, evaluation, or treatment in the ED at this time prior to discharge.           Final Clinical Impression(s) / ED Diagnoses Final diagnoses:  Cough in adult    Rx / DC Orders ED Discharge Orders          Ordered    chlorpheniramine-HYDROcodone (TUSSIONEX) 10-8 MG/5ML  Every 12 hours PRN        06/26/23 2252              Melene Plan, DO 06/26/23 2253

## 2023-06-26 NOTE — ED Triage Notes (Signed)
Pt presents with runny nose, nasal congestion, and persistent dry cough x 2 weeks. Denies fever, aches, or N/V.

## 2023-06-28 ENCOUNTER — Telehealth (HOSPITAL_BASED_OUTPATIENT_CLINIC_OR_DEPARTMENT_OTHER): Payer: Self-pay | Admitting: Emergency Medicine

## 2023-06-28 MED ORDER — HYDROCOD POLI-CHLORPHE POLI ER 10-8 MG/5ML PO SUER: 5.0000 mL | Freq: Two times a day (BID) | ORAL | 0 refills | Status: DC | PRN

## 2023-06-28 NOTE — Telephone Encounter (Signed)
This patient called and stated [facility] does not except her insurance. She is requesting that we call the prescription of Chlorpheniramine in at [facility] on [address].

## 2023-07-09 ENCOUNTER — Emergency Department (HOSPITAL_BASED_OUTPATIENT_CLINIC_OR_DEPARTMENT_OTHER)
Admission: EM | Admit: 2023-07-09 | Discharge: 2023-07-09 | Disposition: A | Discharge status: Home / Self Care | Payer: 59 | Attending: Emergency Medicine | Admitting: Emergency Medicine

## 2023-07-09 ENCOUNTER — Encounter (HOSPITAL_BASED_OUTPATIENT_CLINIC_OR_DEPARTMENT_OTHER): Payer: Self-pay

## 2023-07-09 ENCOUNTER — Other Ambulatory Visit: Payer: Self-pay

## 2023-07-09 DIAGNOSIS — N760 Acute vaginitis: Secondary | ICD-10-CM | POA: Insufficient documentation

## 2023-07-09 DIAGNOSIS — B9689 Other specified bacterial agents as the cause of diseases classified elsewhere: Secondary | ICD-10-CM | POA: Diagnosis not present

## 2023-07-09 DIAGNOSIS — N898 Other specified noninflammatory disorders of vagina: Secondary | ICD-10-CM | POA: Diagnosis not present

## 2023-07-09 LAB — URINALYSIS, ROUTINE W REFLEX MICROSCOPIC
Bilirubin Urine: NEGATIVE
Glucose, UA: NEGATIVE mg/dL
Hgb urine dipstick: NEGATIVE
Ketones, ur: NEGATIVE mg/dL
Nitrite: NEGATIVE
Specific Gravity, Urine: 1.036 — ABNORMAL HIGH (ref 1.005–1.030)
pH: 6 (ref 5.0–8.0)

## 2023-07-09 LAB — COMPREHENSIVE METABOLIC PANEL
ALT: 6 U/L (ref 0–44)
AST: 10 U/L — ABNORMAL LOW (ref 15–41)
Albumin: 4.4 g/dL (ref 3.5–5.0)
Alkaline Phosphatase: 58 U/L (ref 38–126)
Anion gap: 7 (ref 5–15)
BUN: 11 mg/dL (ref 6–20)
CO2: 28 mmol/L (ref 22–32)
Calcium: 9.4 mg/dL (ref 8.9–10.3)
Chloride: 103 mmol/L (ref 98–111)
Creatinine, Ser: 0.7 mg/dL (ref 0.44–1.00)
GFR, Estimated: >60 mL/min (ref >60)
Glucose, Bld: 94 mg/dL (ref 70–99)
Potassium: 3.9 mmol/L (ref 3.5–5.1)
Sodium: 138 mmol/L (ref 135–145)
Total Bilirubin: 0.4 mg/dL (ref 0.3–1.2)
Total Protein: 7.5 g/dL (ref 6.5–8.1)

## 2023-07-09 LAB — CBC WITH DIFFERENTIAL/PLATELET
Abs Immature Granulocytes: 0.01 10*3/uL (ref 0.00–0.07)
Basophils Absolute: 0 10*3/uL (ref 0.0–0.1)
Basophils Relative: 0 %
Eosinophils Absolute: 0.2 10*3/uL (ref 0.0–0.5)
Eosinophils Relative: 3 %
HCT: 38.7 % (ref 36.0–46.0)
Hemoglobin: 13.1 g/dL (ref 12.0–15.0)
Immature Granulocytes: 0 %
Lymphocytes Relative: 38 %
Lymphs Abs: 2.6 10*3/uL (ref 0.7–4.0)
MCH: 31 pg (ref 26.0–34.0)
MCHC: 33.9 g/dL (ref 30.0–36.0)
MCV: 91.7 fL (ref 80.0–100.0)
Monocytes Absolute: 0.6 10*3/uL (ref 0.1–1.0)
Monocytes Relative: 9 %
Neutro Abs: 3.3 10*3/uL (ref 1.7–7.7)
Neutrophils Relative %: 50 %
Platelets: 264 10*3/uL (ref 150–400)
RBC: 4.22 MIL/uL (ref 3.87–5.11)
RDW: 12.9 % (ref 11.5–15.5)
WBC: 6.7 10*3/uL (ref 4.0–10.5)
nRBC: 0 % (ref 0.0–0.2)

## 2023-07-09 LAB — WET PREP, GENITAL
Sperm: NONE SEEN
Trich, Wet Prep: NONE SEEN
WBC, Wet Prep HPF POC: >=10 — AB (ref <10)
Yeast Wet Prep HPF POC: NONE SEEN

## 2023-07-09 MED ORDER — METRONIDAZOLE 0.75 % VA GEL: 1.0000 | Freq: Two times a day (BID) | VAGINAL | 0 refills | Status: DC

## 2023-07-09 NOTE — ED Notes (Signed)
RN reviewed discharge instructions with pt. Pt verbalized understanding and had no further questions. VSS upon discharge.

## 2023-07-09 NOTE — ED Triage Notes (Addendum)
Pt presents with a 1 week hx of pelvic pain and vaginal irritation. Pt reports associated vaginal discharge. Denies N/V/D.

## 2023-07-09 NOTE — Discharge Instructions (Signed)
Please follow up with your primary care physician for further management of routine health concerns, and if your vaginal symptoms persist despite treatment.

## 2023-07-09 NOTE — ED Provider Notes (Signed)
[place] EMERGENCY DEPARTMENT AT [facility] Provider Note   CSN: [id number] Arrival date & time: 07/09/23  1545     History  Chief Complaint  Patient presents with   Vaginal Discharge    Tiffany Welch is a 37 y.o. female.  The history is provided by the patient. No language interpreter was used.  Vaginal Discharge      Home Medications Prior to Admission medications   Medication Sig Start Date End Date Taking? Authorizing Provider  metroNIDAZOLE (METROGEL) 0.75 % vaginal gel Place 1 Applicatorful vaginally 2 (two) times daily. 07/09/23  Yes Brenyn Petrey, Melvenia Beam, PA-C  ALPRAZolam Prudy Feeler) 0.25 MG tablet Take 1/2 to 1 tab by mouth up to twice a day as needed for panic symptoms. 09/21/22   Tollie Eth, NP  chlorpheniramine-HYDROcodone (TUSSIONEX) 10-8 MG/5ML Take 5 mLs by mouth every 12 (twelve) hours as needed for cough. 06/28/23   Rondel Baton, MD  omeprazole (PRILOSEC) 20 MG capsule Take 1 capsule (20 mg total) by mouth daily. 05/19/23   Blue, Soijett A, PA-C  sertraline (ZOLOFT) 50 MG tablet Take 1/2 tab for the first 4 days then increase to 1 tab by mouth at bedtime. Patient not taking: Reported on 09/21/2022 06/22/22   Early, Sung Amabile, NP  Vitamin D, Ergocalciferol, (DRISDOL) 1.25 MG (50000 UNIT) CAPS capsule Take 1 capsule (50,000 Units total) by mouth every 7 (seven) days. 06/22/22   Tollie Eth, NP      Allergies    Codeine and Doxycycline    Review of Systems   Review of Systems  Genitourinary:  Positive for vaginal discharge.    Physical Exam Updated Vital Signs BP (!) 109/58 (BP Location: Right Arm)   Pulse 67   Temp 98.1 F (36.7 C) (Oral)   Resp 14   Ht 5\' 4"  (1.626 m)   Wt 87.5 kg   LMP 06/11/2023   SpO2 97%   BMI 33.13 kg/m  Physical Exam Genitourinary:    Comments: External vagina and vulva have no redness or rash. There is smooth, greenish discharge vaginally. No CMT, no adnexal tenderness or mass.     ED Results / Procedures / Treatments    Labs (all labs ordered are listed, but only abnormal results are displayed) Labs Reviewed  WET PREP, GENITAL - Abnormal; Notable for the following components:      Result Value   Clue Cells Wet Prep HPF POC PRESENT (*)    WBC, Wet Prep HPF POC >=10 (*)    All other components within normal limits  COMPREHENSIVE METABOLIC PANEL - Abnormal; Notable for the following components:   AST 10 (*)    All other components within normal limits  URINALYSIS, ROUTINE W REFLEX MICROSCOPIC - Abnormal; Notable for the following components:   Specific Gravity, Urine 1.036 (*)    Protein, ur TRACE (*)    Leukocytes,Ua LARGE (*)    Bacteria, UA FEW (*)    All other components within normal limits  CBC WITH DIFFERENTIAL/PLATELET  GC/CHLAMYDIA PROBE AMP ([place]) NOT AT [facility]    EKG None  Radiology No results found.  Procedures Procedures    Medications Ordered in ED Medications - No data to display  ED Course/ Medical Decision Making/ A&P Clinical Course as of 07/09/23 1923  Sat Jul 09, 2023  1911 Patient to ED with vulvar burning type vaginal pain and discharge. No pelvic pain, fever, vomiting, or urinary symptoms.   Exam supports absence of concerning pelvic tenderness -  doubt cervicitis, ectopic, torsion. No vulvar rash or redness - is not c/w candidal infection or herpes. Labs reviewed and are essentially negative with the exception of BV. Without pelvic pain, doubt GC/Chlamydia, however, cultures pending. Will treat with Metrogel Vaginal. Strongly encouraged PCP follow up for primary care concerns.  [SU]    Clinical Course User Index [SU] Elpidio Anis, PA-C                                 Medical Decision Making Amount and/or Complexity of Data Reviewed Labs: ordered.           Final Clinical Impression(s) / ED Diagnoses Final diagnoses:  BV (bacterial vaginosis)    Rx / DC Orders ED Discharge Orders          Ordered    metroNIDAZOLE (METROGEL) 0.75 %  vaginal gel  2 times daily        07/09/23 1922              Elpidio Anis, PA-C 07/09/23 1925    Virgina Norfolk, DO 07/09/23 2004

## 2023-07-11 LAB — GC/CHLAMYDIA PROBE AMP (~~LOC~~) NOT AT ARMC
Chlamydia: NEGATIVE
Comment: NEGATIVE
Comment: NORMAL
Neisseria Gonorrhea: NEGATIVE

## 2023-07-14 ENCOUNTER — Ambulatory Visit: Payer: Self-pay | Admitting: Nurse Practitioner

## 2023-07-29 ENCOUNTER — Emergency Department (HOSPITAL_BASED_OUTPATIENT_CLINIC_OR_DEPARTMENT_OTHER)
Admission: EM | Admit: 2023-07-29 | Discharge: 2023-07-30 | Disposition: A | Discharge status: Home / Self Care | Payer: 59 | Attending: Emergency Medicine | Admitting: Emergency Medicine

## 2023-07-29 ENCOUNTER — Encounter (HOSPITAL_BASED_OUTPATIENT_CLINIC_OR_DEPARTMENT_OTHER): Payer: Self-pay | Admitting: Emergency Medicine

## 2023-07-29 ENCOUNTER — Emergency Department (HOSPITAL_BASED_OUTPATIENT_CLINIC_OR_DEPARTMENT_OTHER): Payer: 59

## 2023-07-29 DIAGNOSIS — R059 Cough, unspecified: Secondary | ICD-10-CM | POA: Diagnosis not present

## 2023-07-29 DIAGNOSIS — Z1152 Encounter for screening for COVID-19: Secondary | ICD-10-CM | POA: Diagnosis not present

## 2023-07-29 DIAGNOSIS — R42 Dizziness and giddiness: Secondary | ICD-10-CM | POA: Insufficient documentation

## 2023-07-29 DIAGNOSIS — R051 Acute cough: Secondary | ICD-10-CM | POA: Insufficient documentation

## 2023-07-29 DIAGNOSIS — R000 Tachycardia, unspecified: Secondary | ICD-10-CM | POA: Insufficient documentation

## 2023-07-29 LAB — URINALYSIS, ROUTINE W REFLEX MICROSCOPIC
Bilirubin Urine: NEGATIVE
Glucose, UA: NEGATIVE mg/dL
Hgb urine dipstick: NEGATIVE
Ketones, ur: NEGATIVE mg/dL
Nitrite: NEGATIVE
Specific Gravity, Urine: 1.021 (ref 1.005–1.030)
pH: 8 (ref 5.0–8.0)

## 2023-07-29 LAB — CBC
HCT: 39.5 % (ref 36.0–46.0)
Hemoglobin: 13.5 g/dL (ref 12.0–15.0)
MCH: 30.9 pg (ref 26.0–34.0)
MCHC: 34.2 g/dL (ref 30.0–36.0)
MCV: 90.4 fL (ref 80.0–100.0)
Platelets: 272 10*3/uL (ref 150–400)
RBC: 4.37 MIL/uL (ref 3.87–5.11)
RDW: 12.7 % (ref 11.5–15.5)
WBC: 6.9 10*3/uL (ref 4.0–10.5)
nRBC: 0 % (ref 0.0–0.2)

## 2023-07-29 LAB — BASIC METABOLIC PANEL
Anion gap: 14 (ref 5–15)
BUN: 5 mg/dL — ABNORMAL LOW (ref 6–20)
CO2: 20 mmol/L — ABNORMAL LOW (ref 22–32)
Calcium: 9.9 mg/dL (ref 8.9–10.3)
Chloride: 103 mmol/L (ref 98–111)
Creatinine, Ser: 0.73 mg/dL (ref 0.44–1.00)
GFR, Estimated: >60 mL/min (ref >60)
Glucose, Bld: 165 mg/dL — ABNORMAL HIGH (ref 70–99)
Potassium: 3.7 mmol/L (ref 3.5–5.1)
Sodium: 137 mmol/L (ref 135–145)

## 2023-07-29 LAB — CBG MONITORING, ED: Glucose-Capillary: 164 mg/dL — ABNORMAL HIGH (ref 70–99)

## 2023-07-29 LAB — RESP PANEL BY RT-PCR (RSV, FLU A&B, COVID)  RVPGX2
Influenza A by PCR: NEGATIVE
Influenza B by PCR: NEGATIVE
Resp Syncytial Virus by PCR: NEGATIVE
SARS Coronavirus 2 by RT PCR: NEGATIVE

## 2023-07-29 LAB — PREGNANCY, URINE: Preg Test, Ur: NEGATIVE

## 2023-07-29 MED ORDER — SODIUM CHLORIDE 0.9 % IV BOLUS
1000.0000 mL | Freq: Once | INTRAVENOUS | Status: AC
  Administered 2023-07-29: 1000 mL via INTRAVENOUS

## 2023-07-29 NOTE — ED Provider Notes (Signed)
[facility]ERG[facility] DEPARTMENT AT [facility] Provider Note   CSN: [id number] Arrival date & time: 07/29/23  1931     History  Chief Complaint  Patient presents with   Dizziness    Tiffany Welch is a 37 y.o. female.  Has a history of anxiety.  She said she has been cough for 2 days.  Cough was so severe today that it caused her to have a headache.  Took 2 Excedrin.  While she was driving she started feeling tingly all over and dizzy lightheaded like she might pass out.  She came here for further evaluation.  No fevers.  No focal numbness or weakness.  She did have some blurry vision, no double vision.  The history is provided by the patient.  Dizziness Quality:  Lightheadedness Severity:  Severe Onset quality:  Sudden Timing:  Constant Progression:  Unchanged Chronicity:  New Relieved by:  None tried Worsened by:  Nothing Ineffective treatments:  None tried Associated symptoms: headaches, shortness of breath and vision changes   Associated symptoms: no chest pain, no nausea, no syncope and no vomiting        Home Medications Prior to Admission medications   Medication Sig Start Date End Date Taking? Authorizing Provider  ALPRAZolam Prudy Feeler) 0.25 MG tablet Take 1/2 to 1 tab by mouth up to twice a day as needed for panic symptoms. 09/21/22   Tollie Eth, NP  chlorpheniramine-HYDROcodone (TUSSIONEX) 10-8 MG/5ML Take 5 mLs by mouth every 12 (twelve) hours as needed for cough. 06/28/23   Rondel Baton, MD  metroNIDAZOLE (METROGEL) 0.75 % vaginal gel Place 1 Applicatorful vaginally 2 (two) times daily. 07/09/23   Elpidio Anis, PA-C  omeprazole (PRILOSEC) 20 MG capsule Take 1 capsule (20 mg total) by mouth daily. 05/19/23   Blue, Soijett A, PA-C  sertraline (ZOLOFT) 50 MG tablet Take 1/2 tab for the first 4 days then increase to 1 tab by mouth at bedtime. Patient not taking: Reported on 09/21/2022 06/22/22   Early, Sung Amabile, NP  Vitamin D, Ergocalciferol, (DRISDOL) 1.25 MG  (50000 UNIT) CAPS capsule Take 1 capsule (50,000 Units total) by mouth every 7 (seven) days. 06/22/22   Tollie Eth, NP      Allergies    Codeine and Doxycycline    Review of Systems   Review of Systems  Constitutional:  Negative for fever.  Eyes:  Positive for visual disturbance.  Respiratory:  Positive for cough and shortness of breath.   Cardiovascular:  Negative for chest pain and syncope.  Gastrointestinal:  Negative for nausea and vomiting.  Neurological:  Positive for dizziness and headaches.    Physical Exam Updated Vital Signs BP (!) 143/76 (BP Location: Left Arm)   Pulse (!) 130   Temp 97.7 F (36.5 C)   Resp (!) 30   LMP 07/13/2023   SpO2 100%  Physical Exam Vitals and nursing note reviewed.  Constitutional:      General: She is not in acute distress.    Appearance: Normal appearance. She is well-developed.  HENT:     Head: Normocephalic and atraumatic.  Eyes:     Conjunctiva/sclera: Conjunctivae normal.  Cardiovascular:     Rate and Rhythm: Regular rhythm. Tachycardia present.     Heart sounds: No murmur heard. Pulmonary:     Effort: Pulmonary effort is normal. No respiratory distress.     Breath sounds: Normal breath sounds.  Abdominal:     Palpations: Abdomen is soft.     Tenderness:  There is no abdominal tenderness. There is no guarding or rebound.  Musculoskeletal:        General: No swelling.     Cervical back: Neck supple.  Skin:    General: Skin is warm and dry.     Capillary Refill: Capillary refill takes less than 2 seconds.  Neurological:     General: No focal deficit present.     Mental Status: She is alert and oriented to person, place, and time.     Cranial Nerves: No cranial nerve deficit.     Sensory: No sensory deficit.     Motor: No weakness.     ED Results / Procedures / Treatments   Labs (all labs ordered are listed, but only abnormal results are displayed) Labs Reviewed  BASIC METABOLIC PANEL - Abnormal; Notable for the  following components:      Result Value   CO2 20 (*)    Glucose, Bld 165 (*)    BUN 5 (*)    All other components within normal limits  URINALYSIS, ROUTINE W REFLEX MICROSCOPIC - Abnormal; Notable for the following components:   Protein, ur TRACE (*)    Leukocytes,Ua SMALL (*)    Bacteria, UA RARE (*)    All other components within normal limits  CBG MONITORING, ED - Abnormal; Notable for the following components:   Glucose-Capillary 164 (*)    All other components within normal limits  RESP PANEL BY RT-PCR (RSV, FLU A&B, COVID)  RVPGX2  CBC  PREGNANCY, URINE  CBG MONITORING, ED    EKG EKG Interpretation Date/Time:  Friday July 29 2023 19:57:40 EDT Ventricular Rate:  122 PR Interval:  126 QRS Duration:  80 QT Interval:  322 QTC Calculation: 458 R Axis:   51  Text Interpretation: Sinus tachycardia Nonspecific ST abnormality Abnormal ECG When compared with ECG of 13-Jun-2023 16:50, increased rate and nonspecific t waves now Confirmed by Meridee Score [phone]) on 07/29/2023 8:00:29 PM  Radiology DG Chest Port 1 View  Result Date: 07/29/2023 CLINICAL DATA:  Lightheaded. EXAM: PORTABLE CHEST 1 VIEW COMPARISON:  June 26, 2023 FINDINGS: The heart size and mediastinal contours are within normal limits. Both lungs are clear. The visualized skeletal structures are unremarkable. IMPRESSION: No active disease. Electronically Signed   By: Aram Candela M.D.   On: 07/29/2023 23:00    Procedures Procedures    Medications Ordered in ED Medications  sodium chloride 0.9 % bolus 1,000 mL (0 mLs Intravenous Stopped 07/29/23 2147)    ED Course/ Medical Decision Making/ A&P Clinical Course as of 07/30/23 1038  Fri Jul 29, 2023  2251 Patient states she is feeling a little bit better after treatment although still mildly dizzy.  I reviewed the results of her workup with her.  She is comfortable plan for discharge and outpatient follow-up with PCP.  Return instructions discussed.  [MB]    Clinical Course User Index [MB] Terrilee Files, MD                                 Medical Decision Making Amount and/or Complexity of Data Reviewed Labs: ordered. Radiology: ordered.   This patient complains of cough dizziness near syncope; this involves an extensive number of treatment Options and is a complaint that carries with it a high risk of complications and morbidity. The differential includes dehydration, viral syndrome, pneumonia, COVID, flu, arrhythmia  I ordered, reviewed and interpreted labs, which  included CBC normal chemistries with mildly low bicarb elevated glucose, urinalysis without clear signs of infection, pregnancy negative, COVID and flu negative I ordered medication IV fluids and reviewed PMP when indicated. I ordered imaging studies which included chest x-ray and I independently    visualized and interpreted imaging which showed no acute findings Previous records obtained and reviewed in epic, patient with multiple prior ED visits Cardiac monitoring reviewed, normal sinus rhythm Social determinants considered, no significant barriers Critical Interventions: None  After the interventions stated above, I reevaluated the patient and found awake alert nonfocal neuroexam Admission and further testing considered, no indications for admission or further workup at this time.  Recommended rest and continued hydration, follow-up with PCP.  Return instructions discussed         Final Clinical Impression(s) / ED Diagnoses Final diagnoses:  Acute cough  Dizziness    Rx / DC Orders ED Discharge Orders     None         Terrilee Files, MD 07/30/23 1040

## 2023-07-29 NOTE — ED Triage Notes (Signed)
Light headed dizziness. "Feel like ims gonna fall out"  Started "while coming down the road" Bad headache took excedrin Had cough  Appears in distress during triage

## 2023-07-29 NOTE — Discharge Instructions (Signed)
You were seen in the emergency department for worsening cough dizziness headache.  Your labs were fairly unremarkable and your COVID swab was negative.  Your chest x-ray did not show any pneumonia.  You were given some IV fluids.  Please continue to rest and drink fluids at home.  Follow-up with your primary care doctor.  Return to the emergency department if any worsening or concerning symptoms

## 2023-08-11 ENCOUNTER — Ambulatory Visit (HOSPITAL_BASED_OUTPATIENT_CLINIC_OR_DEPARTMENT_OTHER): Payer: 59 | Admitting: Cardiology

## 2023-08-29 DIAGNOSIS — R051 Acute cough: Secondary | ICD-10-CM | POA: Diagnosis not present

## 2023-08-29 DIAGNOSIS — R059 Cough, unspecified: Secondary | ICD-10-CM | POA: Diagnosis not present

## 2023-08-29 DIAGNOSIS — J069 Acute upper respiratory infection, unspecified: Secondary | ICD-10-CM | POA: Diagnosis not present

## 2023-09-02 ENCOUNTER — Ambulatory Visit (INDEPENDENT_AMBULATORY_CARE_PROVIDER_SITE_OTHER): Payer: 59 | Admitting: Nurse Practitioner

## 2023-09-02 ENCOUNTER — Encounter: Payer: Self-pay | Admitting: Nurse Practitioner

## 2023-09-02 VITALS — BP 128/78 | HR 71 | Wt 196.2 lb

## 2023-09-02 DIAGNOSIS — E559 Vitamin D deficiency, unspecified: Secondary | ICD-10-CM | POA: Diagnosis not present

## 2023-09-02 DIAGNOSIS — B9689 Other specified bacterial agents as the cause of diseases classified elsewhere: Secondary | ICD-10-CM

## 2023-09-02 DIAGNOSIS — H66002 Acute suppurative otitis media without spontaneous rupture of ear drum, left ear: Secondary | ICD-10-CM | POA: Diagnosis not present

## 2023-09-02 DIAGNOSIS — J069 Acute upper respiratory infection, unspecified: Secondary | ICD-10-CM | POA: Diagnosis not present

## 2023-09-02 DIAGNOSIS — R509 Fever, unspecified: Secondary | ICD-10-CM

## 2023-09-02 DIAGNOSIS — H66001 Acute suppurative otitis media without spontaneous rupture of ear drum, right ear: Secondary | ICD-10-CM | POA: Diagnosis not present

## 2023-09-02 MED ORDER — AMOXICILLIN-POT CLAVULANATE 875-125 MG PO TABS: 1.0000 | ORAL_TABLET | Freq: Two times a day (BID) | ORAL | 0 refills | Status: DC

## 2023-09-02 MED ORDER — FLUCONAZOLE 150 MG PO TABS
ORAL_TABLET | ORAL | 0 refills | Status: DC
Start: 2023-09-02 — End: 2023-10-26

## 2023-09-02 MED ORDER — VITAMIN D (ERGOCALCIFEROL) 1.25 MG (50000 UNIT) PO CAPS
50000.0000 [IU] | ORAL_CAPSULE | ORAL | 3 refills | Status: DC
Start: 2023-09-02 — End: 2023-10-26

## 2023-09-02 MED ORDER — CEFTRIAXONE SODIUM 1 G IJ SOLR
1.0000 g | Freq: Once | INTRAMUSCULAR | Status: AC
Start: 2023-09-02 — End: 2023-09-02
  Administered 2023-09-02: 1 g via INTRAMUSCULAR

## 2023-09-02 MED ORDER — METHYLPREDNISOLONE SODIUM SUCC 125 MG IJ SOLR
125.0000 mg | Freq: Once | INTRAMUSCULAR | Status: AC
  Administered 2023-09-02: 125 mg via INTRAMUSCULAR

## 2023-09-02 MED ORDER — AZITHROMYCIN 250 MG PO TABS
ORAL_TABLET | ORAL | 0 refills | Status: AC
Start: 2023-09-02 — End: 2023-09-07

## 2023-09-02 MED ORDER — PREDNISONE 20 MG PO TABS
ORAL_TABLET | ORAL | 0 refills | Status: DC
Start: 2023-09-02 — End: 2023-10-26

## 2023-09-02 MED ORDER — PROMETHAZINE-DM 6.25-15 MG/5ML PO SYRP
5.0000 mL | ORAL_SOLUTION | Freq: Every day | ORAL | 0 refills | Status: DC
Start: 2023-09-02 — End: 2023-10-26

## 2023-09-02 MED ORDER — ALBUTEROL SULFATE HFA 108 (90 BASE) MCG/ACT IN AERS
1.0000 | INHALATION_SPRAY | Freq: Four times a day (QID) | RESPIRATORY_TRACT | 6 refills | Status: DC | PRN
Start: 2023-09-02 — End: 2023-11-22

## 2023-09-02 NOTE — Patient Instructions (Signed)

## 2023-09-02 NOTE — Progress Notes (Signed)
Tollie Eth, DNP, AGNP-c [facility] [address] [place], [place] [address] [phone]   ACUTE VISIT- ESTABLISHED PATIENT  Blood pressure 128/78, pulse 71, weight 196 lb 3.2 oz (89 kg), SpO2 96%.  Subjective:  HPI Tiffany Welch is a 37 y.o. female presents to day for evaluation of acute concern(s).   She started with a cough three weeks ago with a dry cough. She started to feel Monday that her throat was closing and her eyes were swollen suddenly. Symptoms include yellow mucus, rhinorrhea, ear pressure, wheezing, producing cough, and laryngitis.   First day out of work was Tuesday, out Wednesday. Went on Thursday and told to go home. Had to take today (Friday) off).    ROS negative except for what is listed in HPI. History, Medications, Surgery, SDOH, and Family History reviewed and updated as appropriate.  Objective:  Physical Exam Vitals and nursing note reviewed.  Constitutional:      General: She is not in acute distress.    Appearance: She is ill-appearing.  HENT:     Right Ear: A middle ear effusion is present.     Left Ear: A middle ear effusion is present.     Nose:     Right Turbinates: Swollen.     Left Turbinates: Swollen.     Right Sinus: Maxillary sinus tenderness and frontal sinus tenderness present.     Left Sinus: Maxillary sinus tenderness and frontal sinus tenderness present.     Mouth/Throat:     Mouth: Mucous membranes are moist.     Pharynx: Uvula midline. Posterior oropharyngeal erythema and postnasal drip present.     Tonsils: No tonsillar exudate.  Cardiovascular:     Rate and Rhythm: Normal rate and regular rhythm.     Pulses: Normal pulses.     Heart sounds: Normal heart sounds.  Pulmonary:     Breath sounds: Wheezing and rhonchi present.  Lymphadenopathy:     Cervical: Cervical adenopathy present.  Skin:    General: Skin is warm and dry.     Capillary Refill: Capillary refill takes less than 2 seconds.  Neurological:      Mental Status: She is alert and oriented to person, place, and time.  Psychiatric:        Mood and Affect: Mood normal.          Assessment & Plan:   Problem List Items Addressed This Visit     Bacterial URI - Primary    Symptoms consistent with URI with otitis media present. No respiratory distress or alarm symptoms present. Suspect bacterial etiology given the length of time symptoms are present. Will begin treatment with antibiotic and steroids. Rocephin injection today. Fever and rhonchi suggest possible CAP. Treatment for this provided. F/U if no improvement.       Relevant Medications   albuterol (VENTOLIN HFA) 108 (90 Base) MCG/ACT inhaler   amoxicillin-clavulanate (AUGMENTIN) 875-125 MG tablet   predniSONE (DELTASONE) 20 MG tablet   fluconazole (DIFLUCAN) 150 MG tablet   promethazine-dextromethorphan (PROMETHAZINE-DM) 6.25-15 MG/5ML syrup   Non-recurrent acute suppurative otitis media of left ear without spontaneous rupture of tympanic membrane   Relevant Medications   albuterol (VENTOLIN HFA) 108 (90 Base) MCG/ACT inhaler   amoxicillin-clavulanate (AUGMENTIN) 875-125 MG tablet   predniSONE (DELTASONE) 20 MG tablet   fluconazole (DIFLUCAN) 150 MG tablet   Vitamin D deficiency   Relevant Medications   Vitamin D, Ergocalciferol, (DRISDOL) 1.25 MG (50000 UNIT) CAPS capsule   Other Visit Diagnoses  Non-recurrent acute suppurative otitis media of right ear without spontaneous rupture of tympanic membrane       Relevant Medications   amoxicillin-clavulanate (AUGMENTIN) 875-125 MG tablet   fluconazole (DIFLUCAN) 150 MG tablet   cefTRIAXone (ROCEPHIN) injection 1 g (Completed)   Fever, unspecified fever cause             Tollie Eth, DNP, AGNP-c

## 2023-09-05 ENCOUNTER — Encounter: Payer: Self-pay | Admitting: Nurse Practitioner

## 2023-09-12 NOTE — Assessment & Plan Note (Addendum)
Symptoms consistent with URI with otitis media present. No respiratory distress or alarm symptoms present. Suspect bacterial etiology given the length of time symptoms are present. Will begin treatment with antibiotic and steroids. Rocephin injection today. Fever and rhonchi suggest possible CAP. Treatment for this provided. F/U if no improvement.

## 2023-10-03 ENCOUNTER — Ambulatory Visit: Payer: 59 | Admitting: Internal Medicine

## 2023-10-03 NOTE — Progress Notes (Unsigned)
  Cardiology Office Note:  .   Date:  10/03/2023  ID:  Tiffany Welch, DOB Apr 22, 1986, MRN [id number] PCP: Tollie Eth, NP  [facility] Providers Cardiologist:  None { Click to update primary MD,subspecialty MD or APP then REFRESH:1}   History of Present Illness: .   Tiffany Welch is a 37 y.o. female referral for chest pain She has been the emergency room in August reporting chest pain.  ACS was ruled out ECG in October shows sinus tachycardia.  She was in the emergency room at the time and she was feeling really dizzy and lightheaded.   ROS: ***  Studies Reviewed: .        *** Risk Assessment/Calculations:    Physical Exam:   VS:  There were no vitals taken for this visit.   Wt Readings from Last 3 Encounters:  09/02/23 196 lb 3.2 oz (89 kg)  07/09/23 193 lb (87.5 kg)  06/26/23 193 lb (87.5 kg)    GEN: Well nourished, well developed in no acute distress NECK: No JVD; No carotid bruits CARDIAC: ***RRR, no murmurs, rubs, gallops RESPIRATORY:  Clear to auscultation without rales, wheezing or rhonchi  ABDOMEN: Soft, non-tender, non-distended EXTREMITIES:  No edema; No deformity   ASSESSMENT AND PLAN: .   noncardiac chest pain She is low risk for coronary disease, her ECG is normal, she has no signs of HCM.  I do not suspect that her chest pain is cardiac related    {Are you ordering a CV Procedure (e.g. stress test, cath, DCCV, TEE, etc)?   Press F2        :[id number]}  Dispo: Follow up PRN  Signed, Maisie Fus, MD

## 2023-10-07 ENCOUNTER — Encounter (HOSPITAL_BASED_OUTPATIENT_CLINIC_OR_DEPARTMENT_OTHER): Payer: Self-pay

## 2023-10-26 ENCOUNTER — Ambulatory Visit (INDEPENDENT_AMBULATORY_CARE_PROVIDER_SITE_OTHER): Payer: 59 | Admitting: Family Medicine

## 2023-10-26 ENCOUNTER — Encounter: Payer: Self-pay | Admitting: Family Medicine

## 2023-10-26 VITALS — BP 108/78 | HR 60 | Ht 64.5 in | Wt 195.6 lb

## 2023-10-26 DIAGNOSIS — F410 Panic disorder [episodic paroxysmal anxiety] without agoraphobia: Secondary | ICD-10-CM | POA: Diagnosis not present

## 2023-10-26 DIAGNOSIS — E559 Vitamin D deficiency, unspecified: Secondary | ICD-10-CM

## 2023-10-26 DIAGNOSIS — K5900 Constipation, unspecified: Secondary | ICD-10-CM

## 2023-10-26 DIAGNOSIS — R42 Dizziness and giddiness: Secondary | ICD-10-CM | POA: Diagnosis not present

## 2023-10-26 DIAGNOSIS — R5383 Other fatigue: Secondary | ICD-10-CM

## 2023-10-26 NOTE — Patient Instructions (Addendum)
 Continue to drink plenty of water. Use the 1/2 tablet of alprazolam  when needed, if you cannot calm down with the breathe-the-box technique. (Do not take this when driving) Since you are having more anxiety recently, I encourage you to look into finding a therapist. You may also want to schedule a visit with Tiffany Welch (sooner than her next available physical appointment) to discuss other treatment options.  We are checking some bloodwork today to ensure no underlying reasons for your dizziness (ie anemia, from the heavy period). We are also checking your thyroid  and vitamin D  levels.  If the vitamin D  is low, you should still be able to pick up the prescription that was sent in to your pharmacy in November.  If this isn't affordable, at least start taking an over-the-counter vitamin D3 at 5000 IU daily, and plan to take this until you see Tiffany Welch again.   Be sure to eat a high fiber diet to prevent constipation (the small pellets you pass for stools suggest some constipation).

## 2023-10-26 NOTE — Progress Notes (Signed)
 Chief Complaint  Patient presents with   Dizziness    She states she has a history of anxiety. She has been experiencing dizziness off and on for the last few months. She thinks it may be related to her anxiety. She does mention it can be positional at times.    38 year old patient's of Tiffany Welch with panic disorder presents with complaints of dizziness.  She first noted dizziness a few months ago. She was driving back from [place], started feeling a little weird, felt like anxiety attack was coming on.  She tried her breathing exercises, then started feeling very warm, down to her feet.  She drove herself to the hospital. She felt LH, numb. ER visit 10/11--work up was normal. ER note reviewed--she also had cough x2d.  Normal labs (except low CO2, glu up at 165), CXR, respiratory panel. Sinus tach on EKG. She was treated with IV fluids, and was feeling better, discharged.   She hasn't had any further episodes, until a couple of days ago. She had some dizziness, heart was racing, felt anxious. She stayed active, positive, hugged her kids, did her breathing exercises, and she got through it.  Felt better after a couple of minutes.   Today--she woke up, sat on the side of the bed, stood up and felt dizzy. Dizziness got worse while talking to her kids. Her mouth got very dry, like cotton, and she felt tingling throughout her body, down to her feet during this episode.  Her nerves went straight to her stomach--felt like she had to go to the bathroom. Went to the bathroom, and while on the toilet felt more dizzy. She laid down for a few minutes, then went back to the bathroom where she had a bowel movement. It wasn't loose, no diarrhea.  She described bowel movement as passing small pellets. No blood or mucus. She then went back to bed, and fell back asleep. Felt drained when she woke up. No longer dizzy.  Denies any spinning or equilibrium, felt more lightheaded, like she might pass out.  She noticed  some congestion just today. No fever, chills, cough.  She has h/o panic attacks. She is under the care of Tiffany Welch, though hasn't been seen regularly by her (just acute visits in the last year) She states that she was tried on effexor  (tolerated it in the past), but stopped it.  She has a prescription for alprazolam , and 1/2 tablet has been helpful. She only rarely takes this. Having more symptoms recently, hasn't taken the medication.  She has had some more of these episodes while driving (so can't take the xanax ). Last filled #20 in 09/2022.  Vitamin D  deficiency--was prescribed in 08/2023 by Tiffany Welch (for #12 with 3 refills.) She reports she never filled it at that time due to the cost. Not taking any OTC vitamins currently. Asking about getting level rechecked.    PMH, PSH, SH reviewed  Vitamin D  deficiency Anxiety/panic disorder  Not currently taking any medications. Outpatient Encounter Medications as of 10/26/2023  Medication Sig Note   albuterol  (VENTOLIN  HFA) 108 (90 Base) MCG/ACT inhaler Inhale 1-2 puffs into the lungs every 6 (six) hours as needed for wheezing or shortness of breath. (Patient not taking: Reported on 10/26/2023) 10/26/2023: Needed with illness in October, not needed since   ALPRAZolam  (XANAX ) 0.25 MG tablet Take 1/2 to 1 tab by mouth up to twice a day as needed for panic symptoms. (Patient not taking: Reported on 10/26/2023) 10/26/2023: As needed, has not  used in a year   omeprazole  (PRILOSEC) 20 MG capsule Take 1 capsule (20 mg total) by mouth daily. (Patient not taking: Reported on 10/26/2023) 10/26/2023: As needed   [DISCONTINUED] amoxicillin -clavulanate (AUGMENTIN ) 875-125 MG tablet Take 1 tablet by mouth 2 (two) times daily.    [DISCONTINUED] fluconazole  (DIFLUCAN ) 150 MG tablet Repeat in 72 hours if symptoms are not completely resolved.    [DISCONTINUED] predniSONE  (DELTASONE ) 20 MG tablet Take 60mg  PO daily x 2 days, then40mg  PO daily x 2 days, then 20mg  PO daily x 3  days    [DISCONTINUED] promethazine -dextromethorphan (PROMETHAZINE -DM) 6.25-15 MG/5ML syrup Take 5 mLs by mouth at bedtime.    [DISCONTINUED] Vitamin D , Ergocalciferol , (DRISDOL ) 1.25 MG (50000 UNIT) CAPS capsule Take 1 capsule (50,000 Units total) by mouth every 7 (seven) days.    No facility-administered encounter medications on file as of 10/26/2023.    Allergies  Allergen Reactions   Codeine    Doxycycline     ROS: no fever or chills. Mild congestion noted today. No sinus pain, cough, chest pain. Dizziness per HPI.  Menses regular. S/p BTL. Last period (which started 12/30 or 31) was heavier than usual, with some cramping (which isn't typical for her). No dysuria. Reports urine has been light yellow. Constipation--passing small pellets    PHYSICAL EXAM:  BP 108/78 (BP Location: Right Arm, Cuff Size: Normal)   Pulse 60   Ht 5' 4.5 (1.638 m)   Wt 195 lb 9.6 oz (88.7 kg)   LMP 10/18/2023   BMI 33.06 kg/m   Wt Readings from Last 3 Encounters:  10/26/23 195 lb 9.6 oz (88.7 kg)  09/02/23 196 lb 3.2 oz (89 kg)  07/09/23 193 lb (87.5 kg)   Well-appearing, pleasant female, in no distress HEENT: conjunctiva and sclera are clear, EOMI. OP clear, moist mucus membranes Neck: No lymphadenopathy or mass Heart: regular rate and rhythm, no murmur Lungs: clear bilaterally Back: no spinal or CVA tenderness Abdomen: soft, nontender Extremities: no edema Psych: anxious, full range of affect. Normal eye contact, speech, hygiene and grooming Neuro: alert and oriented, cranial nerves grossly intact, normal gait.    ASSESSMENT/PLAN:  Lightheadedness - suspect related to anxiety, suspect hyperventilation (given tingling in body, prior low CO2 with similar episode in ER). Check labs - Plan: Comprehensive metabolic panel, CBC with Differential/Platelet  Panic attack - some increased frequency in anxiety. Encouraged counseling, and f/u with PCP to discuss other meds. Xanax  prn, not with  driving  Vitamin D  deficiency - never took rx as previously prescribed. Recheck level today. If vit D rx isn't affordable, can take 5000 IU daily of OTC D3 - Plan: VITAMIN D  25 Hydroxy (Vit-D Deficiency, Fractures)  Constipation, unspecified constipation type - recommended high fiber diet, encouraged adequate hydration - Plan: TSH  Fatigue, unspecified type - wiped out from episode this morning. Will check labs - Plan: Comprehensive metabolic panel, CBC with Differential/Platelet, VITAMIN D  25 Hydroxy (Vit-D Deficiency, Fractures), TSH  Cbc, chem, TSH, Vitamin D   Continue to drink plenty of water. Use the 1/2 tablet of alprazolam  when needed, if you cannot calm down with the breathe-the-box technique. Since you are having more anxiety recently, I encourage you to look into finding a therapist. You may also want to schedule a visit with Tiffany Welch (sooner than her next available physical appointment) to discuss other treatment options.  We are checking some bloodwork today to ensure no underlying reasons for your dizziness (ie anemia, from the heavy period). We are also checking  your thyroid  and vitamin D  levels.  If the vitamin D  is low, you should still be able to pick up the prescription that was sent in to your pharmacy in November.  If this isn't affordable, at least start taking an over-the-counter vitamin D3 at 5000 IU daily, and plan to take this until you see Tiffany Welch again.   Be sure to eat a high fiber diet to prevent constipation (the small pellets you pass for stools suggest some constipation).    Schedule CPE with SB

## 2023-10-27 LAB — COMPREHENSIVE METABOLIC PANEL
ALT: 7 [IU]/L (ref 0–32)
AST: 11 [IU]/L (ref 0–40)
Albumin: 4.3 g/dL (ref 3.9–4.9)
Alkaline Phosphatase: 79 [IU]/L (ref 44–121)
BUN/Creatinine Ratio: 5 — ABNORMAL LOW (ref 9–23)
BUN: 4 mg/dL — ABNORMAL LOW (ref 6–20)
Bilirubin Total: 0.3 mg/dL (ref 0.0–1.2)
CO2: 26 mmol/L (ref 20–29)
Calcium: 8.9 mg/dL (ref 8.7–10.2)
Chloride: 105 mmol/L (ref 96–106)
Creatinine, Ser: 0.76 mg/dL (ref 0.57–1.00)
Globulin, Total: 2.4 g/dL (ref 1.5–4.5)
Glucose: 86 mg/dL (ref 70–99)
Potassium: 4.5 mmol/L (ref 3.5–5.2)
Sodium: 142 mmol/L (ref 134–144)
Total Protein: 6.7 g/dL (ref 6.0–8.5)
eGFR: 103 mL/min/{1.73_m2} (ref >59)

## 2023-10-27 LAB — CBC WITH DIFFERENTIAL/PLATELET
Basophils Absolute: 0 10*3/uL (ref 0.0–0.2)
Basos: 1 %
EOS (ABSOLUTE): 0.1 10*3/uL (ref 0.0–0.4)
Eos: 3 %
Hematocrit: 38.4 % (ref 34.0–46.6)
Hemoglobin: 12.7 g/dL (ref 11.1–15.9)
Immature Grans (Abs): 0 10*3/uL (ref 0.0–0.1)
Immature Granulocytes: 0 %
Lymphocytes Absolute: 1.3 10*3/uL (ref 0.7–3.1)
Lymphs: 36 %
MCH: 30.8 pg (ref 26.6–33.0)
MCHC: 33.1 g/dL (ref 31.5–35.7)
MCV: 93 fL (ref 79–97)
Monocytes Absolute: 0.3 10*3/uL (ref 0.1–0.9)
Monocytes: 8 %
Neutrophils Absolute: 1.9 10*3/uL (ref 1.4–7.0)
Neutrophils: 52 %
Platelets: 285 10*3/uL (ref 150–450)
RBC: 4.13 x10E6/uL (ref 3.77–5.28)
RDW: 12.2 % (ref 11.7–15.4)
WBC: 3.6 10*3/uL (ref 3.4–10.8)

## 2023-10-27 LAB — VITAMIN D 25 HYDROXY (VIT D DEFICIENCY, FRACTURES): Vit D, 25-Hydroxy: 12.5 ng/mL — ABNORMAL LOW (ref 30.0–100.0)

## 2023-10-27 LAB — TSH: TSH: 0.403 u[IU]/mL — ABNORMAL LOW (ref 0.450–4.500)
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
## 2023-11-22 ENCOUNTER — Ambulatory Visit (INDEPENDENT_AMBULATORY_CARE_PROVIDER_SITE_OTHER): Payer: 59 | Admitting: Nurse Practitioner

## 2023-11-22 ENCOUNTER — Encounter: Payer: Self-pay | Admitting: Nurse Practitioner

## 2023-11-22 VITALS — BP 124/82 | HR 56 | Ht 65.0 in | Wt 196.0 lb

## 2023-11-22 DIAGNOSIS — Z130 Encounter for screening for diseases of the blood and blood-forming organs and certain disorders involving the immune mechanism: Secondary | ICD-10-CM | POA: Diagnosis not present

## 2023-11-22 DIAGNOSIS — R0989 Other specified symptoms and signs involving the circulatory and respiratory systems: Secondary | ICD-10-CM

## 2023-11-22 DIAGNOSIS — F411 Generalized anxiety disorder: Secondary | ICD-10-CM

## 2023-11-22 DIAGNOSIS — Z1321 Encounter for screening for nutritional disorder: Secondary | ICD-10-CM

## 2023-11-22 DIAGNOSIS — G43839 Menstrual migraine, intractable, without status migrainosus: Secondary | ICD-10-CM

## 2023-11-22 DIAGNOSIS — Z23 Encounter for immunization: Secondary | ICD-10-CM

## 2023-11-22 DIAGNOSIS — Z1231 Encounter for screening mammogram for malignant neoplasm of breast: Secondary | ICD-10-CM

## 2023-11-22 DIAGNOSIS — Z13228 Encounter for screening for other metabolic disorders: Secondary | ICD-10-CM | POA: Diagnosis not present

## 2023-11-22 DIAGNOSIS — F410 Panic disorder [episodic paroxysmal anxiety] without agoraphobia: Secondary | ICD-10-CM

## 2023-11-22 DIAGNOSIS — Z1329 Encounter for screening for other suspected endocrine disorder: Secondary | ICD-10-CM | POA: Diagnosis not present

## 2023-11-22 DIAGNOSIS — Z803 Family history of malignant neoplasm of breast: Secondary | ICD-10-CM | POA: Diagnosis not present

## 2023-11-22 DIAGNOSIS — R7989 Other specified abnormal findings of blood chemistry: Secondary | ICD-10-CM

## 2023-11-22 DIAGNOSIS — Z0000 Encounter for general adult medical examination without abnormal findings: Secondary | ICD-10-CM

## 2023-11-22 DIAGNOSIS — Z113 Encounter for screening for infections with a predominantly sexual mode of transmission: Secondary | ICD-10-CM

## 2023-11-22 DIAGNOSIS — E010 Iodine-deficiency related diffuse (endemic) goiter: Secondary | ICD-10-CM

## 2023-11-22 MED ORDER — SERTRALINE HCL 25 MG PO TABS: 25.0000 mg | ORAL_TABLET | Freq: Every day | ORAL | 3 refills | Status: DC

## 2023-11-22 MED ORDER — ALPRAZOLAM 0.25 MG PO TABS: ORAL_TABLET | ORAL | 2 refills | Status: DC

## 2023-11-22 NOTE — Assessment & Plan Note (Signed)
 Severe headaches before and during menstrual cycles, consistent with menstrual migraines related to hormonal changes. Symptoms include light sensitivity and relief in a dark, quiet room. Discussed treatment options including propranolol and as-needed medications. Birth control could help by controlling hormone levels but may still cause hormonal dips during periods. Patient prefers to consider options before deciding. -Patient to let me know if chronic preventative treatment vs abortive treatment desired.  - Evaluation with GYN for options that may be helpful in the setting of desire for sterilization.

## 2023-11-22 NOTE — Assessment & Plan Note (Signed)
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
## 2023-11-22 NOTE — Assessment & Plan Note (Signed)
Neck fullness and swelling with thyroid nodule monitoring. Recent labs indicate hyperthyroidism. Discussed impact on weight and anxiety. Need for thyroid function tests to reassess levels. - Order thyroid function tests.

## 2023-11-22 NOTE — Patient Instructions (Addendum)
 Things look very good today! I will let you know what your labs show and if we need to make any changes based on these, I will let you know.    Keep working on your breathing techniques and anxiety reducing symptoms.   Be mindful for possible triggers. There may not be any trigger at times.   D3 2000-5000iu daily. You can get any brand or formula.   Let me know if you would like to try a medication to help prevent migraines. Propranolol can help with both migraine headaches and anxiety when taken daily.  Maxalt is very effective at stopping menstrual migraines.

## 2023-11-22 NOTE — Assessment & Plan Note (Signed)
Significant family history of breast cancer (mother, maternal grandmother, sister). Jahan Friedlander screening warranted due to high risk. - Order Tiffany Welch mammogram screening.

## 2023-11-23 LAB — RPR: RPR Ser Ql: NONREACTIVE

## 2023-11-24 LAB — CBC WITH DIFFERENTIAL/PLATELET
Basophils Absolute: 0 10*3/uL (ref 0.0–0.2)
Basos: 0 %
EOS (ABSOLUTE): 0.1 10*3/uL (ref 0.0–0.4)
Eos: 2 %
Hematocrit: 36.2 % (ref 34.0–46.6)
Hemoglobin: 12 g/dL (ref 11.1–15.9)
Immature Grans (Abs): 0 10*3/uL (ref 0.0–0.1)
Immature Granulocytes: 0 %
Lymphocytes Absolute: 2 10*3/uL (ref 0.7–3.1)
Lymphs: 51 %
MCH: 30.7 pg (ref 26.6–33.0)
MCHC: 33.1 g/dL (ref 31.5–35.7)
MCV: 93 fL (ref 79–97)
Monocytes Absolute: 0.3 10*3/uL (ref 0.1–0.9)
Monocytes: 7 %
Neutrophils Absolute: 1.6 10*3/uL (ref 1.4–7.0)
Neutrophils: 40 %
Platelets: 258 10*3/uL (ref 150–450)
RBC: 3.91 x10E6/uL (ref 3.77–5.28)
RDW: 12.5 % (ref 11.7–15.4)
WBC: 4 10*3/uL (ref 3.4–10.8)

## 2023-11-24 LAB — CMP14+EGFR
ALT: 5 IU/L (ref 0–32)
AST: 10 IU/L (ref 0–40)
Albumin: 4.5 g/dL (ref 3.9–4.9)
Alkaline Phosphatase: 70 IU/L (ref 44–121)
BUN/Creatinine Ratio: 14 (ref 9–23)
BUN: 10 mg/dL (ref 6–20)
Bilirubin Total: 0.4 mg/dL (ref 0.0–1.2)
CO2: 23 mmol/L (ref 20–29)
Calcium: 9.4 mg/dL (ref 8.7–10.2)
Chloride: 104 mmol/L (ref 96–106)
Creatinine, Ser: 0.7 mg/dL (ref 0.57–1.00)
Globulin, Total: 2.4 g/dL (ref 1.5–4.5)
Glucose: 87 mg/dL (ref 70–99)
Potassium: 3.9 mmol/L (ref 3.5–5.2)
Sodium: 140 mmol/L (ref 134–144)
Total Protein: 6.9 g/dL (ref 6.0–8.5)
eGFR: 113 mL/min/{1.73_m2} (ref >59)

## 2023-11-24 LAB — HCV INTERPRETATION

## 2023-11-24 LAB — STI PROFILE, CT/NG/TV
Chlamydia by NAA: NEGATIVE
Gonococcus by NAA: NEGATIVE
HCV Ab: NONREACTIVE
HIV Screen 4th Generation wRfx: NONREACTIVE
Hep B Core Total Ab: NEGATIVE
Hep B Surface Ab, Qual: REACTIVE
Hepatitis B Surface Ag: NEGATIVE
RPR Ser Ql: NONREACTIVE
Trich vag by NAA: NEGATIVE

## 2023-11-24 LAB — HEMOGLOBIN A1C
Est. average glucose Bld gHb Est-mCnc: 111 mg/dL
Hgb A1c MFr Bld: 5.5 % (ref 4.8–5.6)

## 2023-11-24 LAB — TSH: TSH: 0.412 u[IU]/mL — ABNORMAL LOW (ref 0.450–4.500)

## 2023-11-24 LAB — HEPATITIS C ANTIBODY

## 2023-11-25 ENCOUNTER — Telehealth: Payer: Self-pay | Admitting: *Deleted

## 2023-11-25 NOTE — Progress Notes (Signed)
 Complex Care Management Note Care Guide Note  11/25/2023 Name: Adelyna Brockman MRN: [id number] DOB: 1985/12/21  Danaye Sobh is a 38 y.o. year old female who is a primary care patient of Early, Camie BRAVO, NP . The community resource team was consulted for assistance with Food Insecurity  SDOH screenings and interventions completed:  No        Care guide performed the following interventions: Patient provided with information about care guide support team and interviewed to confirm resource needs.  Follow Up Plan:   Patient at work will reach out to her Monday am at 10;15 on her lunch break   Encounter Outcome:  Patient Visit Completed Asencion Gell  [facility] HealthPopulation Health Care Guide  Direct Dial:[phone] [phone] Website: [email or web address]

## 2023-11-28 ENCOUNTER — Telehealth: Payer: Self-pay | Admitting: *Deleted

## 2023-11-28 NOTE — Progress Notes (Signed)
 Complex Care Management Note Care Guide Note  11/28/2023 Name: Tiffany Welch MRN: [id number] DOB: 10-Sep-1986   Complex Care Management Outreach Attempts: An unsuccessful telephone outreach was attempted today to offer the patient information about available complex care management services.  Follow Up Plan:  Additional outreach attempts will be made to offer the patient complex care management information and services.   Encounter Outcome:  No Answer [name] HealthPopulation Health Care Guide  Direct Dial:[phone] [phone] Website: [email or web address]

## 2023-11-30 ENCOUNTER — Telehealth: Payer: Self-pay | Admitting: *Deleted

## 2023-11-30 ENCOUNTER — Encounter: Payer: Self-pay | Admitting: Nurse Practitioner

## 2023-11-30 NOTE — Progress Notes (Signed)
Complex Care Management Note Care Guide Note  11/30/2023 Name: Octivia Canion MRN: [id number] DOB: [birth year]   Complex Care Management Outreach Attempts: A third unsuccessful outreach was attempted today to offer the patient with information about available complex care management services.  Follow Up Plan:  No further outreach attempts will be made at this time. We have been unable to contact the patient to offer or enroll patient in complex care management services.  Encounter Outcome:  Patient Visit Completed  Dione Booze  [facility] Care Guide  Direct [phone] Fax:[phone] Website: [email or web address]

## 2023-11-30 NOTE — Addendum Note (Signed)
Addended by: Lenita Peregrina, Hunt[place] Dec E on: 11/30/2023 02:30 PM   Modules accepted: Orders

## 2023-12-06 ENCOUNTER — Other Ambulatory Visit: Payer: Self-pay | Admitting: Nurse Practitioner

## 2023-12-06 DIAGNOSIS — F410 Panic disorder [episodic paroxysmal anxiety] without agoraphobia: Secondary | ICD-10-CM

## 2023-12-07 ENCOUNTER — Ambulatory Visit: Payer: 59

## 2023-12-08 ENCOUNTER — Ambulatory Visit (HOSPITAL_BASED_OUTPATIENT_CLINIC_OR_DEPARTMENT_OTHER): Payer: 59

## 2023-12-15 ENCOUNTER — Other Ambulatory Visit: Payer: Self-pay

## 2023-12-15 ENCOUNTER — Emergency Department (HOSPITAL_BASED_OUTPATIENT_CLINIC_OR_DEPARTMENT_OTHER)
Admission: EM | Admit: 2023-12-15 | Discharge: 2023-12-15 | Disposition: A | Discharge status: Home / Self Care | Payer: 59 | Attending: Emergency Medicine | Admitting: Emergency Medicine

## 2023-12-15 ENCOUNTER — Ambulatory Visit: Payer: Self-pay | Admitting: Nurse Practitioner

## 2023-12-15 ENCOUNTER — Encounter (HOSPITAL_BASED_OUTPATIENT_CLINIC_OR_DEPARTMENT_OTHER): Payer: Self-pay

## 2023-12-15 ENCOUNTER — Emergency Department (HOSPITAL_BASED_OUTPATIENT_CLINIC_OR_DEPARTMENT_OTHER): Payer: 59 | Admitting: Radiology

## 2023-12-15 DIAGNOSIS — J029 Acute pharyngitis, unspecified: Secondary | ICD-10-CM | POA: Diagnosis not present

## 2023-12-15 DIAGNOSIS — J069 Acute upper respiratory infection, unspecified: Secondary | ICD-10-CM | POA: Diagnosis not present

## 2023-12-15 DIAGNOSIS — R0602 Shortness of breath: Secondary | ICD-10-CM | POA: Diagnosis not present

## 2023-12-15 DIAGNOSIS — R059 Cough, unspecified: Secondary | ICD-10-CM | POA: Diagnosis present

## 2023-12-15 DIAGNOSIS — R9431 Abnormal electrocardiogram [ECG] [EKG]: Secondary | ICD-10-CM | POA: Diagnosis not present

## 2023-12-15 LAB — GROUP A STREP BY PCR: Group A Strep by PCR: NOT DETECTED

## 2023-12-15 LAB — RESP PANEL BY RT-PCR (RSV, FLU A&B, COVID)  RVPGX2
Influenza A by PCR: NEGATIVE
Influenza B by PCR: NEGATIVE
Resp Syncytial Virus by PCR: NEGATIVE
SARS Coronavirus 2 by RT PCR: NEGATIVE

## 2023-12-15 MED ORDER — ALBUTEROL SULFATE HFA 108 (90 BASE) MCG/ACT IN AERS
2.0000 | INHALATION_SPRAY | RESPIRATORY_TRACT | Status: DC | PRN
Start: 2023-12-15 — End: 2023-12-15

## 2023-12-15 MED ORDER — ONDANSETRON 4 MG PO TBDP: 4.0000 mg | ORAL_TABLET | Freq: Once | ORAL | Status: DC

## 2023-12-15 MED ORDER — BENZONATATE 100 MG PO CAPS
100.0000 mg | ORAL_CAPSULE | Freq: Once | ORAL | Status: AC
  Administered 2023-12-15: 100 mg via ORAL
  Filled 2023-12-15: qty 1

## 2023-12-15 MED ORDER — LIDOCAINE VISCOUS HCL 2 % MT SOLN
15.0000 mL | Freq: Once | OROMUCOSAL | Status: AC
  Administered 2023-12-15: 15 mL via OROMUCOSAL
  Filled 2023-12-15: qty 15

## 2023-12-15 MED ORDER — BENZONATATE 100 MG PO CAPS: 100.0000 mg | ORAL_CAPSULE | Freq: Three times a day (TID) | ORAL | 0 refills | Status: DC

## 2023-12-15 NOTE — ED Provider Notes (Signed)
 [facility] EMERGENCY DEPARTMENT AT [facility] Provider Note   CSN: [id number] Arrival date & time: 12/15/23  4098     History  Chief Complaint  Patient presents with   URI    Tiffany Welch is a 38 y.o. female past medical history of allergic rhinitis, panic disorder presenting to the emergency room with 4 days of cough, Russian and sore throat.  She denies any fevers and chills.  She denies chest pain or shortness of breath.  She reports she has been taking over-the-counter medications like cold and flu medicine as well as Motrin for body aches.  She reports sick contacts.  Her kids recently had flu A as well as strep.   URI Presenting symptoms: congestion        Home Medications Prior to Admission medications   Medication Sig Start Date End Date Taking? Authorizing Provider  ALPRAZolam Prudy Feeler) 0.25 MG tablet Take 1/2 to 1 tab by mouth up to twice a day as needed for panic symptoms. 11/22/23   Tollie Eth, NP  sertraline (ZOLOFT) 25 MG tablet Take 1 tablet (25 mg total) by mouth daily. 11/22/23   Tollie Eth, NP      Allergies    Codeine and Doxycycline    Review of Systems   Review of Systems  HENT:  Positive for congestion.     Physical Exam Updated Vital Signs BP 122/79   Pulse 78   Temp 98.1 F (36.7 C)   Resp 13   LMP 11/18/2023   SpO2 99%  Physical Exam Vitals and nursing note reviewed.  Constitutional:      General: She is not in acute distress.    Appearance: She is not toxic-appearing.  HENT:     Head: Normocephalic and atraumatic.  Eyes:     General: No scleral icterus.    Conjunctiva/sclera: Conjunctivae normal.  Cardiovascular:     Rate and Rhythm: Normal rate and regular rhythm.     Pulses: Normal pulses.     Heart sounds: Normal heart sounds.  Pulmonary:     Effort: Pulmonary effort is normal. No respiratory distress.     Breath sounds: Normal breath sounds.  Abdominal:     General: Abdomen is flat. Bowel sounds are normal.      Palpations: Abdomen is soft.     Tenderness: There is no abdominal tenderness.  Skin:    General: Skin is warm and dry.     Findings: No lesion.  Neurological:     General: No focal deficit present.     Mental Status: She is alert and oriented to person, place, and time. Mental status is at baseline.     ED Results / Procedures / Treatments   Labs (all labs ordered are listed, but only abnormal results are displayed) Labs Reviewed  RESP PANEL BY RT-PCR (RSV, FLU A&B, COVID)  [id number]  GROUP A STREP BY PCR    EKG None  Radiology DG Chest 2 View Result Date: 12/15/2023 CLINICAL DATA:  Shortness of breath EXAM: CHEST - 2 VIEW COMPARISON:  Chest radiograph 07/29/2023 FINDINGS: The heart size and mediastinal contours are within normal limits. Both lungs are clear. The visualized skeletal structures are unremarkable. IMPRESSION: No active cardiopulmonary disease. Electronically Signed   By: Annia Belt M.D.   On: 12/15/2023 13:20    Procedures Procedures    Medications Ordered in ED Medications  albuterol (VENTOLIN HFA) 108 (90 Base) MCG/ACT inhaler 2 puff (has no administration in time range)  benzonatate (TESSALON) capsule 100 mg (100 mg Oral Given 12/15/23 1259)  lidocaine (XYLOCAINE) 2 % viscous mouth solution 15 mL (15 mLs Mouth/Throat Given 12/15/23 1300)    ED Course/ Medical Decision Making/ A&P                                 Medical Decision Making Amount and/or Complexity of Data Reviewed Radiology: ordered.  Risk Prescription drug management.   [name] 38 y.o. presented today for URI like symptoms. Working DDx that I considered at this time includes, but not limited to, viral illness, pharyngitis, mono, sinusitis, electrolyte abnormality, AOM.  R/o DDx:: these additional diagnoses are not consistent with patient's history, presentation, physical exam, labs/imaging findings.   Labs:  Respiratory Panel: Neg Group A Strep: Neg  Imaging:  neg  Problem  List / ED Course / Critical interventions / Medication management  Patient reporting to emergency room with complaint of cough congestion and sore throat.  Given recent contact with strep throat did swab for strep throat which was negative.  Her respiratory panel was negative.  Given her duration of symptoms did obtain chest x-ray that shows no acute abnormality.  Upon arrival she is hemodynamically stable and well-appearing.  Her lungs are clear to auscultation with no sign of distress.  Her symptoms are consistent with a viral upper respiratory tract infection.  Although mentioned in triage note she does not have any shortness of breath.  She does note that significant congestion has made it difficult to breathe through her nose.  Given reassuring workup and presentation feel she stable for discharge.  Will send home with Tessalon Perles for cough which have improved her symptoms significantly during stay. I ordered medication including viscous lidocaine for sore throat, Tessalon Perles for cough Reevaluation of the patient after these medicines showed that the patient resolved Patients vitals assessed. Upon arrival patient is hemodynamically stable.  I have reviewed the patients home medicines and have made adjustments as needed     Plan:  F/u w/ PCP in 2-3d to ensure resolution of sx.  Patient was given return precautions. Patient stable for discharge at this time.  Patient educated on sx and dx and verbalized understanding of plan. Return to ER if new or worsening sx.          Final Clinical Impression(s) / ED Diagnoses Final diagnoses:  None    Rx / DC Orders ED Discharge Orders     None         Smitty Knudsen, PA-C 12/15/23 1351    Arby Barrette, MD 12/19/23 1504

## 2023-12-15 NOTE — Telephone Encounter (Signed)
 Copied from CRM [id number]. Topic: Clinical - Red Word Triage >> Dec 15, 2023  9:29 AM Elle L wrote: Red Word that prompted transfer to Nurse Triage: The patient states that she has a bad cough that is causing pain and burning in her chest and she is coughing up discolored mucus.   Chief Complaint: Cough Symptoms: Productive cough with yellow mucous, congestion, sore throat, burning in her chest with coughing,  Frequency: 4 days Pertinent Negatives: Patient denies Fever, history of blood clots, vomiting, diarrhea Disposition: [x] ED /[] Urgent Care (no appt availability in office) / [] Appointment(In office/virtual)/ []  [place] [facility]/ [] Home Care/ [] Refused Recommended Disposition /[] [place] [facility]/ []  Follow-up with PCP Additional Notes: Patient called and advised that she has been feeling sick for the past 4 days and getting worse. Patient is advised that she was exposed to the flu and strep throat with her children and a friend weeks ago.  She states that she wore a mask and wore gloves and this sickness came on quickly and she just kept getting worse and worse.  She advised she lives up three flights of stairs and thought she was going to need Oxygen the last time she walked up her stairs. Patient is speaking in short phrases before having to stop and catch her breath.  Patient is continuously coughing and states that it burns when she coughs in her chest.  She denies any known fevers, history of blood clots, vomiting, or diarrhea. Patient is advised that having difficulty breathing even when at rest, along with the chest burning during coughing, sore throat, coughing up yellow mucous, and sounding very short of breath it is advised that she goes to the emergency room at this time for immediate medical attention. Patient agrees with this recommendation. She is advised that on the way to the hospital if at any point she or whoever is with her can pull over and call 911 for an ambulance  if she gets worse. Patient verbalized understanding.  Reason for Disposition  [1] MODERATE difficulty breathing (e.g., speaks in phrases, SOB even at rest, pulse 100-120) AND [2] NEW-onset or WORSE than normal  Answer Assessment - Initial Assessment Questions 1. RESPIRATORY STATUS: "Describe your breathing?" (e.g., wheezing, shortness of breath, unable to speak, severe coughing)      Pt states short of breath at rest and also very short of breath on exertion 2. ONSET: "When did this breathing problem begin?"      4 days ago 3. PATTERN "Does the difficult breathing come and go, or has it been constant since it started?"      Got worse in the past few days 4. SEVERITY: "How bad is your breathing?" (e.g., mild, moderate, severe)    - MILD: No SOB at rest, mild SOB with walking, speaks normally in sentences, can lie down, no retractions, pulse < 100.    - MODERATE: SOB at rest, SOB with minimal exertion and prefers to sit, cannot lie down flat, speaks in phrases, mild retractions, audible wheezing, pulse 100-120.    - SEVERE: Very SOB at rest, speaks in single words, struggling to breathe, sitting hunched forward, retractions, pulse > 120      Shortness of breath at rest and worse with exertion 5. RECURRENT SYMPTOM: "Have you had difficulty breathing before?" If Yes, ask: "When was the last time?" and "What happened that time?"      Beginning of January --went to PCP & was treated at that time 6. CARDIAC HISTORY: "  Do you have any history of heart disease?" (e.g., heart attack, angina, bypass surgery, angioplasty)      No 7. LUNG HISTORY: "Do you have any history of lung disease?"  (e.g., pulmonary embolus, asthma, emphysema)     No 8. CAUSE: "What do you think is causing the breathing problem?"      congestion 9. OTHER SYMPTOMS: "Do you have any other symptoms? (e.g., dizziness, runny nose, cough, chest pain, fever)     Runny nose,  10. O2 SATURATION MONITOR:  "Do you use an oxygen saturation  monitor (pulse oximeter) at home?" If Yes, ask: "What is your reading (oxygen level) today?" "What is your usual oxygen saturation reading?" (e.g., 95%)       No 11. PREGNANCY: "Is there any chance you are pregnant?" "When was your last menstrual period?"       No--beginning of the month 12. TRAVEL: "Have you traveled out of the country in the last month?" (e.g., travel history, exposures)       No  Answer Assessment - Initial Assessment Questions 1. ONSET: "When did the cough begin?"      4 days ago 2. SEVERITY: "How bad is the cough today?"      Gotten worse 3. SPUTUM: "Describe the color of your sputum" (none, dry cough; clear, white, yellow, green)     yellow 4. HEMOPTYSIS: "Are you coughing up any blood?" If so ask: "How much?" (flecks, streaks, tablespoons, etc.)     No 5. DIFFICULTY BREATHING: "Are you having difficulty breathing?" If Yes, ask: "How bad is it?" (e.g., mild, moderate, severe)    - MILD: No SOB at rest, mild SOB with walking, speaks normally in sentences, can lie down, no retractions, pulse < 100.    - MODERATE: SOB at rest, SOB with minimal exertion and prefers to sit, cannot lie down flat, speaks in phrases, mild retractions, audible wheezing, pulse 100-120.    - SEVERE: Very SOB at rest, speaks in single words, struggling to breathe, sitting hunched forward, retractions, pulse > 120      SOB at rest and a lot worse on exertion 6. FEVER: "Do you have a fever?" If Yes, ask: "What is your temperature, how was it measured, and when did it start?"     No 7. CARDIAC HISTORY: "Do you have any history of heart disease?" (e.g., heart attack, congestive heart failure)      No 8. LUNG HISTORY: "Do you have any history of lung disease?"  (e.g., pulmonary embolus, asthma, emphysema)     No 9. PE RISK FACTORS: "Do you have a history of blood clots?" (or: recent major surgery, recent prolonged travel, bedridden)     No 10. OTHER SYMPTOMS: "Do you have any other symptoms?"  (e.g., runny nose, wheezing, chest pain)       Congestion, sneezing, runny nose, congestion,  11. PREGNANCY: "Is there any chance you are pregnant?" "When was your last menstrual period?"       No--beginning of this month 12. TRAVEL: "Have you traveled out of the country in the last month?" (e.g., travel history, exposures)       No  Protocols used: Cough - Acute Productive-A-AH, Breathing Difficulty-A-AH

## 2023-12-15 NOTE — ED Notes (Signed)
 Pt given discharge instructions and reviewed prescriptions. Opportunities given for questions. Pt verbalizes understanding. Jillyn Hidden, RN

## 2023-12-15 NOTE — Discharge Instructions (Signed)
 You were seen in emergency room today for upper respiratory tract infection.  If sent the medication Tessalon Perles to your pharmacy to take as needed for cough.  For the nasal congestion I would recommend daily antihistamine like Claritin you can also use Benadryl in the evening for congestion.  I would also recommend Flonase as well as nasal saline rinse.  Make sure you are staying well-hydrated with water he can alternate Pedialyte or Gatorade as needed.  If you develop muscle aches or fever would recommend Tylenol and ibuprofen.  Return to emergency room if you have any new or worsening symptoms.

## 2023-12-15 NOTE — ED Triage Notes (Signed)
 Pt c/o URI since Sunday night/ Monday. Pt advises that her kids just got over flu & strep.   OTC meds for symptoms, "some relief." Motrin for aches, "it helps some."

## 2023-12-15 NOTE — Telephone Encounter (Signed)
 Pt is at ED already, please ignore previous message

## 2023-12-27 ENCOUNTER — Ambulatory Visit: Payer: 59

## 2023-12-29 ENCOUNTER — Encounter: Payer: Self-pay | Admitting: Nurse Practitioner

## 2023-12-29 ENCOUNTER — Encounter: Payer: 59 | Admitting: Nurse Practitioner

## 2023-12-29 NOTE — Progress Notes (Signed)
 Unable to connect with patient during visit. CMA unable to reach by phone for rooming. It appears the patient was logged in, but when attempting to join she was not on the visit. Text message for video invite sent and [facility] message sent. Will work her back in as we can for f/u.

## 2024-01-31 ENCOUNTER — Emergency Department (HOSPITAL_BASED_OUTPATIENT_CLINIC_OR_DEPARTMENT_OTHER)
Admission: EM | Admit: 2024-01-31 | Discharge: 2024-02-01 | Disposition: A | Discharge status: Home / Self Care | Attending: Emergency Medicine | Admitting: Emergency Medicine

## 2024-01-31 ENCOUNTER — Ambulatory Visit: Admitting: Family Medicine

## 2024-01-31 ENCOUNTER — Emergency Department (HOSPITAL_COMMUNITY)

## 2024-01-31 ENCOUNTER — Ambulatory Visit: Payer: Self-pay

## 2024-01-31 ENCOUNTER — Emergency Department (HOSPITAL_BASED_OUTPATIENT_CLINIC_OR_DEPARTMENT_OTHER)

## 2024-01-31 ENCOUNTER — Other Ambulatory Visit: Payer: Self-pay

## 2024-01-31 ENCOUNTER — Telehealth: Payer: Self-pay | Admitting: Nurse Practitioner

## 2024-01-31 ENCOUNTER — Encounter (HOSPITAL_BASED_OUTPATIENT_CLINIC_OR_DEPARTMENT_OTHER): Payer: Self-pay | Admitting: Emergency Medicine

## 2024-01-31 DIAGNOSIS — I639 Cerebral infarction, unspecified: Secondary | ICD-10-CM | POA: Diagnosis not present

## 2024-01-31 DIAGNOSIS — R059 Cough, unspecified: Secondary | ICD-10-CM | POA: Insufficient documentation

## 2024-01-31 DIAGNOSIS — R200 Anesthesia of skin: Secondary | ICD-10-CM | POA: Diagnosis not present

## 2024-01-31 DIAGNOSIS — R531 Weakness: Secondary | ICD-10-CM | POA: Diagnosis not present

## 2024-01-31 DIAGNOSIS — R0600 Dyspnea, unspecified: Secondary | ICD-10-CM | POA: Insufficient documentation

## 2024-01-31 DIAGNOSIS — R519 Headache, unspecified: Secondary | ICD-10-CM | POA: Insufficient documentation

## 2024-01-31 DIAGNOSIS — R0981 Nasal congestion: Secondary | ICD-10-CM | POA: Insufficient documentation

## 2024-01-31 DIAGNOSIS — M4802 Spinal stenosis, cervical region: Secondary | ICD-10-CM | POA: Diagnosis not present

## 2024-01-31 DIAGNOSIS — R209 Unspecified disturbances of skin sensation: Secondary | ICD-10-CM | POA: Diagnosis not present

## 2024-01-31 DIAGNOSIS — R29818 Other symptoms and signs involving the nervous system: Secondary | ICD-10-CM | POA: Diagnosis not present

## 2024-01-31 DIAGNOSIS — R29898 Other symptoms and signs involving the musculoskeletal system: Secondary | ICD-10-CM | POA: Diagnosis not present

## 2024-01-31 DIAGNOSIS — R202 Paresthesia of skin: Secondary | ICD-10-CM | POA: Diagnosis not present

## 2024-01-31 DIAGNOSIS — G35 Multiple sclerosis: Secondary | ICD-10-CM | POA: Diagnosis not present

## 2024-01-31 LAB — COMPREHENSIVE METABOLIC PANEL WITH GFR
ALT: 9 U/L (ref 0–44)
AST: 15 U/L (ref 15–41)
Albumin: 4 g/dL (ref 3.5–5.0)
Alkaline Phosphatase: 63 U/L (ref 38–126)
Anion gap: 9 (ref 5–15)
BUN: 11 mg/dL (ref 6–20)
CO2: 26 mmol/L (ref 22–32)
Calcium: 9.8 mg/dL (ref 8.9–10.3)
Chloride: 102 mmol/L (ref 98–111)
Creatinine, Ser: 0.73 mg/dL (ref 0.44–1.00)
GFR, Estimated: >60 mL/min (ref >60)
Glucose, Bld: 101 mg/dL — ABNORMAL HIGH (ref 70–99)
Potassium: 3.7 mmol/L (ref 3.5–5.1)
Sodium: 137 mmol/L (ref 135–145)
Total Bilirubin: 0.4 mg/dL (ref 0.0–1.2)
Total Protein: 7.6 g/dL (ref 6.5–8.1)

## 2024-01-31 LAB — DIFFERENTIAL
Abs Immature Granulocytes: 0.01 10*3/uL (ref 0.00–0.07)
Basophils Absolute: 0 10*3/uL (ref 0.0–0.1)
Basophils Relative: 1 %
Eosinophils Absolute: 0.3 10*3/uL (ref 0.0–0.5)
Eosinophils Relative: 5 %
Immature Granulocytes: 0 %
Lymphocytes Relative: 38 %
Lymphs Abs: 1.9 10*3/uL (ref 0.7–4.0)
Monocytes Absolute: 0.4 10*3/uL (ref 0.1–1.0)
Monocytes Relative: 7 %
Neutro Abs: 2.5 10*3/uL (ref 1.7–7.7)
Neutrophils Relative %: 49 %

## 2024-01-31 LAB — URINALYSIS, ROUTINE W REFLEX MICROSCOPIC
Bilirubin Urine: NEGATIVE
Glucose, UA: NEGATIVE mg/dL
Hgb urine dipstick: NEGATIVE
Ketones, ur: NEGATIVE mg/dL
Leukocytes,Ua: NEGATIVE
Nitrite: NEGATIVE
Protein, ur: NEGATIVE mg/dL
Specific Gravity, Urine: 1.02 (ref 1.005–1.030)
pH: 7 (ref 5.0–8.0)

## 2024-01-31 LAB — CBC
HCT: 38.4 % (ref 36.0–46.0)
Hemoglobin: 13.1 g/dL (ref 12.0–15.0)
MCH: 31.1 pg (ref 26.0–34.0)
MCHC: 34.1 g/dL (ref 30.0–36.0)
MCV: 91.2 fL (ref 80.0–100.0)
Platelets: 264 10*3/uL (ref 150–400)
RBC: 4.21 MIL/uL (ref 3.87–5.11)
RDW: 12.7 % (ref 11.5–15.5)
WBC: 5.1 10*3/uL (ref 4.0–10.5)
nRBC: 0 % (ref 0.0–0.2)

## 2024-01-31 LAB — RAPID URINE DRUG SCREEN, HOSP PERFORMED
Amphetamines: NOT DETECTED
Barbiturates: NOT DETECTED
Benzodiazepines: NOT DETECTED
Cocaine: NOT DETECTED
Opiates: NOT DETECTED
Tetrahydrocannabinol: NOT DETECTED

## 2024-01-31 LAB — PROTIME-INR
INR: 1 (ref 0.8–1.2)
Prothrombin Time: 13.7 s (ref 11.4–15.2)

## 2024-01-31 LAB — ETHANOL: Alcohol, Ethyl (B): <10 mg/dL (ref <10)

## 2024-01-31 LAB — CBG MONITORING, ED: Glucose-Capillary: 113 mg/dL — ABNORMAL HIGH (ref 70–99)

## 2024-01-31 LAB — APTT: aPTT: 28 s (ref 24–36)

## 2024-01-31 LAB — PREGNANCY, URINE: Preg Test, Ur: NEGATIVE

## 2024-01-31 LAB — TROPONIN I (HIGH SENSITIVITY): Troponin I (High Sensitivity): 3 ng/L (ref <18)

## 2024-01-31 MED ORDER — GADOBUTROL 1 MMOL/ML IV SOLN: 9.0000 mL | Freq: Once | INTRAVENOUS | Status: DC | PRN

## 2024-01-31 MED ORDER — GADOBUTROL 1 MMOL/ML IV SOLN
9.0000 mL | Freq: Once | INTRAVENOUS | Status: AC | PRN
  Administered 2024-01-31: 9 mL via INTRAVENOUS

## 2024-01-31 MED ORDER — PROCHLORPERAZINE EDISYLATE 10 MG/2ML IJ SOLN
10.0000 mg | Freq: Once | INTRAMUSCULAR | Status: AC
  Administered 2024-01-31: 10 mg via INTRAVENOUS
  Filled 2024-01-31: qty 2

## 2024-01-31 MED ORDER — LORAZEPAM 2 MG/ML IJ SOLN
1.0000 mg | Freq: Once | INTRAMUSCULAR | Status: AC | PRN
  Administered 2024-01-31: 1 mg via INTRAVENOUS
  Filled 2024-01-31: qty 1

## 2024-01-31 MED ORDER — DIPHENHYDRAMINE HCL 50 MG/ML IJ SOLN
25.0000 mg | Freq: Once | INTRAMUSCULAR | Status: AC
  Administered 2024-01-31: 25 mg via INTRAVENOUS
  Filled 2024-01-31: qty 1

## 2024-01-31 MED ORDER — IOHEXOL 350 MG/ML SOLN
75.0000 mL | Freq: Once | INTRAVENOUS | Status: AC | PRN
  Administered 2024-01-31: 75 mL via INTRAVENOUS

## 2024-01-31 NOTE — Progress Notes (Signed)
 Attempted MRI. Pt unable due to claustrophobia but does not want to try meds other than benadryl - willing to try again later tonight once benadryl kicks in and she is sleepy.

## 2024-01-31 NOTE — ED Provider Notes (Signed)
 [place] [facility] Provider Note   CSN: [id number] Arrival date & time: 01/31/24  1516     History  Chief Complaint  Patient presents with   multiple complaints   Weakness    Sol Odor is a 38 y.o. female.  HPI     38 year old female with a history of anxiety, depression, who presents with concern for left-sided numbness and weakness.  On my history, said that she did feel some discomfort under her left arm, sensation of heaviness, but otherwise did not have other neurologic symptoms and felt ok this morning and prior to her nap at 1 PM.  Reports she then woke up from her nap with sensation of numbness and weakness to her left arm and leg.  Reports that the numbness feels its on her whole left side, arm, left side and left leg.  She denies any facial numbness.  Denies any difficulty talking, visual changes.  No chest pain or shortness of breath today but did report having some over the weekend.  She reported a headache yesterday but no headache today.  .  Reports has had congestion and cough for a long period of time -months, will come and go, sometimes will cough up what tastes like blood--usually this will happen after she eats.  Has not had fever, nausea, vomiting.  Had sensation of arm heaviness over the last week. Sunday did have episode of heaviness in chest that resolved.  Felt some dyspnea going up the stairs but then tat resolved as well. Does not have any chest pain nor dyspnea.  No leg pain or swelling. Is not on birth control.  Had gone to [place] last weekend, less than 2 hour drive.  Has hx of what she believes are migraines around when she has her menses.  Menses was last week. Headache yesterday but not today.    Past Medical History:  Diagnosis Date   Anxiety    Bacterial URI 07/15/2022   Exposure to the flu 10/06/2022   Non-recurrent acute suppurative otitis media of left ear without spontaneous rupture of tympanic membrane  10/06/2022   Seasonal allergic rhinitis due to pollen 06/22/2022   Wellness examination 06/01/2022     Home Medications Prior to Admission medications   Medication Sig Start Date End Date Taking? Authorizing Provider  ALPRAZolam (XANAX) 0.25 MG tablet Take 1/2 to 1 tab by mouth up to twice a day as needed for panic symptoms. 11/22/23   Early, Sara E, NP  benzonatate (TESSALON) 100 MG capsule Take 1 capsule (100 mg total) by mouth every 8 (eight) hours. 12/15/23   Barrett, Jamie N, PA-C  sertraline (ZOLOFT) 25 MG tablet Take 1 tablet (25 mg total) by mouth daily. 11/22/23   Early, Sara E, NP      Allergies    Codeine and Doxycycline    Review of Systems   Review of Systems  Physical Exam Updated Vital Signs BP 112/80   Pulse 66   Temp 98.2 F (36.8 C) (Oral)   Resp 19   Ht 5\' 5"  (1.651 m)   Wt 88.9 kg   LMP 01/24/2024 (Exact Date)   SpO2 100%   BMI 32.62 kg/m  Physical Exam Constitutional:      General: She is not in acute distress.    Appearance: Normal appearance. She is not ill-appearing.  HENT:     Head: Normocephalic and atraumatic.  Eyes:     General: No visual field deficit.  Extraocular Movements: Extraocular movements intact.     Conjunctiva/sclera: Conjunctivae normal.     Pupils: Pupils are equal, round, and reactive to light.  Cardiovascular:     Rate and Rhythm: Normal rate and regular rhythm.     Pulses: Normal pulses.  Pulmonary:     Effort: Pulmonary effort is normal. No respiratory distress.  Musculoskeletal:        General: No swelling or tenderness.     Cervical back: Normal range of motion.  Skin:    General: Skin is warm and dry.     Findings: No erythema or rash.  Neurological:     General: No focal deficit present.     Mental Status: She is alert and oriented to person, place, and time.     GCS: GCS eye subscore is 4. GCS verbal subscore is 5. GCS motor subscore is 6.     Cranial Nerves: No cranial nerve deficit, dysarthria or facial  asymmetry.     Sensory: Sensory deficit (left sided) present.     Motor: Weakness (left arm and leg) present. No tremor.     Coordination: Coordination normal. Finger-Nose-Finger Test normal.     Gait: Gait normal.     ED Results / Procedures / Treatments   Labs (all labs ordered are listed, but only abnormal results are displayed) Labs Reviewed  COMPREHENSIVE METABOLIC PANEL WITH GFR - Abnormal; Notable for the following components:      Result Value   Glucose, Bld 101 (*)    All other components within normal limits  CBG MONITORING, ED - Abnormal; Notable for the following components:   Glucose-Capillary 113 (*)    All other components within normal limits  ETHANOL  PROTIME-INR  APTT  CBC  DIFFERENTIAL  RAPID URINE DRUG SCREEN, HOSP PERFORMED  URINALYSIS, ROUTINE W REFLEX MICROSCOPIC  PREGNANCY, URINE  TROPONIN I (HIGH SENSITIVITY)  TROPONIN I (HIGH SENSITIVITY)    EKG EKG Interpretation Date/Time:  Tuesday January 31 2024 15:23:20 EDT Ventricular Rate:  65 PR Interval:  128 QRS Duration:  78 QT Interval:  374 QTC Calculation: 388 R Axis:   42  Text Interpretation: Normal sinus rhythm with sinus arrhythmia Normal ECG When compared with ECG of 15-Dec-2023 10:01, No significant change since last tracing Confirmed by Scarlette Currier ([id number]) on 01/31/2024 4:36:11 PM  Radiology CT ANGIO HEAD NECK W WO CM (CODE STROKE) Result Date: 01/31/2024 CLINICAL DATA:  Neuro deficit, acute, stroke suspected. Left upper and lower extremity weakness. EXAM: CT ANGIOGRAPHY HEAD AND NECK WITH AND WITHOUT CONTRAST TECHNIQUE: Multidetector CT imaging of the head and neck was performed using the standard protocol during bolus administration of intravenous contrast. Multiplanar CT image reconstructions and MIPs were obtained to evaluate the vascular anatomy. Carotid stenosis measurements (when applicable) are obtained utilizing NASCET criteria, using the distal internal carotid diameter as the  denominator. RADIATION DOSE REDUCTION: This exam was performed according to the departmental dose-optimization program which includes automated exposure control, adjustment of the mA and/or kV according to patient size and/or use of iterative reconstruction technique. CONTRAST:  75mL OMNIPAQUE IOHEXOL 350 MG/ML SOLN COMPARISON:  None Available. FINDINGS: CTA NECK FINDINGS Aortic arch: Standard branching. Widely patent brachiocephalic and subclavian arteries. Right carotid system: Patent and smooth without evidence of stenosis or dissection. Left carotid system: Patent and smooth without evidence of stenosis or dissection. Vertebral arteries: Patent and smooth without evidence of stenosis or dissection. Codominant. Skeleton: No acute osseous abnormality or suspicious lesion. Other neck: No evidence of  cervical lymphadenopathy or mass. Upper chest: Clear lung apices. Review of the MIP images confirms the above findings CTA HEAD FINDINGS Anterior circulation: The internal carotid arteries are widely patent from skull base to carotid termini. ACAs and MCAs are patent without evidence of a proximal branch occlusion or significant proximal stenosis. Apparent diffuse branch vessel irregularity is favored to be artifactual. No aneurysm is identified. Posterior circulation: The intracranial vertebral arteries are widely patent to the basilar. The basilar artery is widely patent. There are moderate-sized right and small left posterior communicating arteries. Both PCAs are patent without evidence of a significant proximal stenosis. No aneurysm is identified. Venous sinuses: Patent. Anatomic variants: None. Review of the MIP images confirms the above findings These results were communicated to Dr. Wilford Corner at 4:09 pm on 01/31/2024 by text page via the [facility] messaging system. IMPRESSION: Negative CTA of the head and neck. Electronically Signed   By: Sebastian Ache M.D.   On: 01/31/2024 16:14   CT HEAD CODE STROKE WO CONTRAST Result  Date: 01/31/2024 CLINICAL DATA:  Code stroke. Left arm dull pain, left arm numbness radiating into the left leg as well. EXAM: CT HEAD WITHOUT CONTRAST TECHNIQUE: Contiguous axial images were obtained from the base of the skull through the vertex without intravenous contrast. RADIATION DOSE REDUCTION: This exam was performed according to the departmental dose-optimization program which includes automated exposure control, adjustment of the mA and/or kV according to patient size and/or use of iterative reconstruction technique. COMPARISON:  None Available. FINDINGS: Brain: No acute intracranial hemorrhage. No CT evidence of acute infarct. No edema, mass effect, or midline shift. The basilar cisterns are patent. Ventricles: The ventricles are normal. Vascular: No hyperdense vessel or unexpected calcification. Skull: No acute or aggressive finding. Orbits: Orbits are symmetric. Sinuses: Mucosal thickening in the bilateral ethmoid sinuses. Mild mucosal thickening in the right greater than left maxillary sinuses. Additional likely mucosal thickening and inspissated secretions in the left frontal sinus. Other: Mastoid air cells are clear. ASPECTS [facility] Stroke Program Early CT Score) - Ganglionic level infarction (caudate, lentiform nuclei, internal capsule, insula, M1-M3 cortex): 7 - Supraganglionic infarction (M4-M6 cortex): 3 Total score (0-10 with 10 being normal): 10 IMPRESSION: 1. No CT evidence of acute intracranial abnormality. 2. ASPECTS is 10 These results were communicated to Dr. Wilford Corner At 3:50 pm on 01/31/2024 by text page via the [facility] messaging system. Electronically Signed   By: Emily Filbert M.D.   On: 01/31/2024 15:50    Procedures Procedures    Medications Ordered in ED Medications  LORazepam (ATIVAN) injection 1 mg (has no administration in time range)  iohexol (OMNIPAQUE) 350 MG/ML injection 75 mL (75 mLs Intravenous Contrast Given 01/31/24 1550)  prochlorperazine (COMPAZINE) injection 10 mg  (10 mg Intravenous Given 01/31/24 1648)  diphenhydrAMINE (BENADRYL) injection 25 mg (25 mg Intravenous Given 01/31/24 1643)    ED Course/ Medical Decision Making/ A&P                                   38 year old female with a history of anxiety, depression, who presents with concern for left-sided numbness and weakness.  Differential diagnosis includes ICH, CVA, MS, cervical pathology, electrolyte abnormalities, complicated migraine..  Called code stroke on arrival given perceived last known well is 1 PM with left-sided numbness and weakness, however in further discussion with neurology, she has been having some sensation of left-sided heaviness over the last week--while she denies  weakness like she has today, with some left-sided symptoms and sensations feel her last known well is unclear and risks of TNK too high.  Considered possibility of complicated migraine and gave headache cocktail which she reports has improved her symptoms somewhat, but continues to have numbness and weakness of the left side.  Labs are completed and personally evaluate interpreted by me show negative alcohol level, no anemia or electrolyte abnormalities, negative UDS, no sign of UTI, negative pregnancy test.  Due to her sensation of arm heaviness, also evaluate troponins which were negative.  Of low suspicion for ACS.  Have any chest pain or shortness of breath, has normal bilateral upper and lower extremity pulses, and have low clinical suspicion for PE, dissection.  Reports some cough over longer period of time which usually occurs after eating.  Consider possibility of reflux, other aspiration.  Chest x-ray was completed.  Will send to [facility] for MRI with and without brain and cervical spine.        Final Clinical Impression(s) / ED Diagnoses Final diagnoses:  Left-sided weakness  Left sided numbness    Rx / DC Orders ED Discharge Orders     None         Scarlette Currier, MD 01/31/24  1844

## 2024-01-31 NOTE — Telephone Encounter (Signed)
 Chief Complaint: Rib pain/coughing/congestion  Symptoms: see notes Frequency: One week Pertinent Negatives: Patient denies chest pain Disposition: [] ED /[] Urgent Care (no appt availability in office) / [x] Appointment(In office/virtual)/ []  [place] [facility]/ [] Home Care/ [] Refused Recommended Disposition /[] [place] [place] [facility]/ []  Follow-up with PCP Additional Notes: Patient called in to make an appt for evaluation, stating she has been experiencing dull heaviness/pain under her left armpit that extends down her left side. Patient states this has been constant and present for one week. Patient has had an intermittent cough, and nasal congestion. Patient states she does suffer from anxiety and has had intermittent pressure in her chest but believes that to be anxiety related. Patient appt made for today for further evaluation.   Copied from CRM [id number]. Topic: Clinical - Red Word Triage >> Jan 31, 2024 12:28 PM Higinio Roger wrote: Red Word that prompted transfer to Nurse Triage: difficulty breathing, pain on left side from under arm and going towards waist. Congestion, having to breathe out of mouth, chills Reason for Disposition  [1] MILD-MODERATE pain AND [2] constant AND [3] present > 2 hours  Answer Assessment - Initial Assessment Questions 1. LOCATION: "Where does it hurt?"      Under left armpit and goes down to left waist 2. RADIATION: "Does the pain shoot anywhere else?" (e.g., chest, back)     See above 3. ONSET: "When did the pain begin?" (e.g., minutes, hours or days ago)      Roughly 1 week ago 4. SUDDEN: "Gradual or sudden onset?"     Gradual  5. PATTERN "Does the pain come and go, or is it constant?"    - If it comes and goes: "How long does it last?" "Do you have pain now?"     (Note: Comes and goes means the pain is intermittent. It goes away completely between bouts.)    - If constant: "Is it getting better, staying the same, or getting worse?"      (Note: Constant  means the pain never goes away completely; most serious pain is constant and gets worse.)      Constant 6. SEVERITY: "How bad is the pain?"  (e.g., Scale 1-10; mild, moderate, or severe)    - MILD (1-3): Doesn't interfere with normal activities, abdomen soft and not tender to touch..     - MODERATE (4-7): Interferes with normal activities or awakens from sleep, abdomen tender to touch.     - SEVERE (8-10): Excruciating pain, doubled over, unable to do any normal activities.       8 7. RECURRENT SYMPTOM: "Have you ever had this type of stomach pain before?" If Yes, ask: "When was the last time?" and "What happened that time?"      No 8. AGGRAVATING FACTORS: "Does anything seem to cause this pain?" (e.g., foods, stress, alcohol)      Patient states if she lays on the left side there is pain 9. CARDIAC SYMPTOMS: "Do you have any of the following symptoms: chest pain, difficulty breathing, sweating, nausea?"     No 10. OTHER SYMPTOMS: "Do you have any other symptoms?" (e.g., back pain, diarrhea, fever, urination pain, vomiting)       Chills, heaviness in side of chest 11. PREGNANCY: "Is there any chance you are pregnant?" "When was your last menstrual period?"       No  Protocols used: Abdominal Pain - Upper-A-AH

## 2024-01-31 NOTE — ED Provider Notes (Signed)
 7:46 PM Patient transferred from [facility] for MRI.  She feels like her symptoms are slightly better but still present in the left arm and leg.  No significant headache.  No chest pain for several days, will cancel second troponin as ACS seems unlikely.  11:05 PM There was a delay in getting the MRI as the patient initially could not do it but then eventually did take the Ativan and was able to complete MRI.  MRI is currently pending.  Care transferred to Dr. Morris Arch.   Jerilynn Montenegro, MD 01/31/24 [phone]

## 2024-01-31 NOTE — Telephone Encounter (Signed)
 Lvm for pt. Dr.Lowne has recommended for her to go to the ER based on sxs.

## 2024-01-31 NOTE — ED Notes (Addendum)
 Called [facility] for transport to [facility] ED @18 :17  Spoke with Burdette Carolin

## 2024-01-31 NOTE — Progress Notes (Signed)
 AHTelestroke RN Code Stroke Note-   1536- code stroke activation-pt is in CT 1541- Dr Bonnita Buttner paged [phone]- Dr Bonnita Buttner on camera, completed brief assessment, ordered CTA 1546- Pt taken back to room and assessment completed.   No TNK d/t LKW changed to ~1 week ago.  MRS 0

## 2024-01-31 NOTE — ED Triage Notes (Addendum)
 Pt POV- c/o congestion since Sunday. Denies ear pain. Denies fever. Uses home MDI intermittently. No asthma hx.   Sunday chest heaviness, none at time of triage.   L arm dull pain, L arm numbness that radiates to left leg.  Pt actively moving all extremities in triage. L arm weakness noted when woke up from nap, LKW appx 1300 today. EDP made aware 1532.  VAN neg

## 2024-01-31 NOTE — Consult Note (Signed)
 [facility] Telemedicine Consult   Requesting Provider: Dr Dalene Seltzer Consult Participants: Dr. Marthe Patch, Telespecialist RN Amy   Bedside RN Trinna Post Location of the provider: [facility] Location of the patient: [id number]  This consult was provided via telemedicine with 2-way video and audio communication. The patient/family was informed that care would be provided in this way and agreed to receive care in this manner.   Chief Complaint: Left-sided numbness  HPI: 38 year old with past medical history of anxiety and depression, along with headaches that she describes as migraines that happen around the time of her cycle, presents to the emergency department for evaluation of progressively worsening left arm heaviness and numbness which now involves the left leg as well. The patient reports that for the past 1 week she has had a numbness that has been centered around her left arm.-Felt more like an ache and discomfort but has progressed to the point where now the left arm feels numb and weak and this has also started to involve the left leg.  She had an appointment with her outpatient primary care but her symptoms appear to be worsening over the period of time for which they recommended that she be evaluated in the ER as imaging might be required. Reports a headache yesterday but no headache currently today. Only takes Motrin for headaches Has been suffering from some nasal congestion but does not report any chest pain or shortness of breath. Reports that she has these coughing spells off and on where she tastes phlegm with blood in her mouth but no blood ever comes out. She has been evaluated multiple times for these coughing spells without clear explanation. Denies any abnormal stressors at this time.   Past Medical History:  Diagnosis Date   Anxiety    Bacterial URI 07/15/2022   Exposure to the flu 10/06/2022   Non-recurrent acute suppurative otitis media of left ear without spontaneous  rupture of tympanic membrane 10/06/2022   Seasonal allergic rhinitis due to pollen 06/22/2022   Wellness examination 06/01/2022    No current facility-administered medications for this encounter.  Current Outpatient Medications:    ALPRAZolam (XANAX) 0.25 MG tablet, Take 1/2 to 1 tab by mouth up to twice a day as needed for panic symptoms., Disp: 20 tablet, Rfl: 2   benzonatate (TESSALON) 100 MG capsule, Take 1 capsule (100 mg total) by mouth every 8 (eight) hours., Disp: 21 capsule, Rfl: 0   sertraline (ZOLOFT) 25 MG tablet, Take 1 tablet (25 mg total) by mouth daily., Disp: 30 tablet, Rfl: 3    LKW: Almost a week ago when the left-sided discomfort/heaviness/numbness started with acute worsening over the last few hours 2 days IV thrombolysis given?: No, outside the window IR Thrombectomy? No, outside the window Modified Rankin Scale: 0-Completely asymptomatic and back to baseline post- stroke Time of teleneurologist evaluation: 1546 hrs.  Exam: Vitals:   01/31/24 1527  BP: 116/79  Pulse: 70  Resp: 17  Temp: 98.2 F (36.8 C)  SpO2: 100%    General: Awake alert in no distress HEENT: Normocephalic atraumatic Neurological exam Awake alert oriented x 3.  No dysarthria.  No aphasia. Cranial nerves II to XII intact Motor examination reveals significant weakness in the left upper and lower extremity in comparison to the right.  Sensory exam: She has diminished sensation to light touch on the left forehead, left arm and left leg without any change in sensation on the trunk. Coordination: No dysmetria    NIHSS 1A: Level of Consciousness -  0 1B: Ask Month and Age - 0 1C: 'Blink Eyes' & 'Squeeze Hands' - 0 2: Test Horizontal Extraocular Movements - 0 3: Test Visual Fields - 0 4: Test Facial Palsy - 0 5A: Test Left Arm Motor Drift - 2 5B: Test Right Arm Motor Drift - 0 6A: Test Left Leg Motor Drift - 1 6B: Test Right Leg Motor Drift - 0 7: Test Limb Ataxia - 0 8: Test  Sensation - 1 9: Test Language/Aphasia- 0 10: Test Dysarthria - 0 11: Test Extinction/Inattention - 0 NIHSS score: 4   Imaging Reviewed: Code stroke NCCT head with no acute findings.  Preliminary review of CT angiography head and neck negative for acute process CT perfusion study not done because the initial timeline was last known well within 4-1/2 hours but it turns out that the symptoms have been ongoing for a week-this was discovered after imaging completion.  Exam also not consistent with LVO  Labs reviewed in epic and pertinent values follow: CBC    Component Value Date/Time   WBC 4.0 11/22/2023 1618   WBC 6.9 07/29/2023 1956   RBC 3.91 11/22/2023 1618   RBC 4.37 07/29/2023 1956   HGB 12.0 11/22/2023 1618   HCT 36.2 11/22/2023 1618   PLT 258 11/22/2023 1618   MCV 93 11/22/2023 1618   MCH 30.7 11/22/2023 1618   MCH 30.9 07/29/2023 1956   MCHC 33.1 11/22/2023 1618   MCHC 34.2 07/29/2023 1956   RDW 12.5 11/22/2023 1618   LYMPHSABS 2.0 11/22/2023 1618   MONOABS 0.6 07/09/2023 1752   EOSABS 0.1 11/22/2023 1618   BASOSABS 0.0 11/22/2023 1618   CMP     Component Value Date/Time   NA 140 11/22/2023 1618   K 3.9 11/22/2023 1618   CL 104 11/22/2023 1618   CO2 23 11/22/2023 1618   GLUCOSE 87 11/22/2023 1618   GLUCOSE 165 (H) 07/29/2023 1956   BUN 10 11/22/2023 1618   CREATININE 0.70 11/22/2023 1618   CALCIUM 9.4 11/22/2023 1618   PROT 6.9 11/22/2023 1618   ALBUMIN 4.5 11/22/2023 1618   AST 10 11/22/2023 1618   ALT 5 11/22/2023 1618   ALKPHOS 70 11/22/2023 1618   BILITOT 0.4 11/22/2023 1618   GFRNONAA >60 07/29/2023 1956     Assessment: 38 year old with history of anxiety and depression along with history of headaches around the time of her cycle (question history of catamenial migraine), presents for evaluation of worsening and progressive left-sided heaviness and numbness.  Symptoms started with left underarm numbness and heaviness and have progressed to a point  where she is weak and numb in the left arm and also having trouble with the left leg and also having some difficulty walking. Her last known well is outside the window for any acute thrombolytic or thrombectomy intervention. Exam is somewhat perplexing because of sparing of most of the face other than the forehead and most of the trunk but involvement of arm and leg only. I suspect a component of anxiety versus complex migraine-did have a headache yesterday. If treatment with migraine cocktail does not work, she may need imaging of the brain and the C-spine.  Impression: Left-sided paresthesias with a broad differential-complicated migraine versus subacute stroke versus demyelinating versus inflammatory etiology.  Recommendations:  Migraine cocktail IV If symptoms improve, no further workup If symptoms do not improve after migraine cocktail, I would recommend sending her over to [facility] for MRI of the brain with and without contrast and MRI of the  C-spine with and without contrast to rule out an acute stroke versus a cord pathology the likes of which can be seen with demyelination due to broad set of differentials that includes subacute stroke versus demyelinating disease versus inflammatory process. At this was discussed with Dr. Tamela Fake via secure chat Please call neurology at [facility] should the patient have abnormal imaging or require further assistance from a neurological standpoint. -- Tona Francis, MD Neurologist [facility] Pager: [phone]

## 2024-02-01 DIAGNOSIS — R200 Anesthesia of skin: Secondary | ICD-10-CM

## 2024-02-01 NOTE — ED Provider Notes (Signed)
 I assumed care of this patient from previous provider.  Please see their note for further details of history, exam, and MDM.   Briefly patient is a 38 y.o. female who presented with left-sided paresthesias sent here for an MRI.    MRIs are negative for stroke, demyelinating disease or masses.  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 Paresthesias are significantly improved after migraine cocktail.   Will provide contact information for neurology.  The patient appears reasonably screened and/or stabilized for discharge and I doubt any other medical condition or other [facility] requiring further screening, evaluation, or treatment in the ED at this time. I have discussed the findings, Dx and Tx plan with the patient/family who expressed understanding and agree(s) with the plan. Discharge instructions discussed at length. The patient/family was given strict return precautions who verbalized understanding of the instructions. No further questions at time of discharge.  Disposition: Discharge  Condition: Good  ED Discharge Orders          Ordered    Ambulatory referral to Neurology       Comments: An appointment is requested in approximately: 2 weeks   02/01/24 0010             Follow Up: Annella Kief, NP [address] [place] [address] [phone]  Call  to schedule an appointment for close follow up       Kaitlyn Skowron, Camila Cecil, MD 02/01/24 0010

## 2024-02-27 ENCOUNTER — Telehealth: Payer: Self-pay

## 2024-02-27 NOTE — Telephone Encounter (Signed)
 Left message for pt to call office back regarding appt change.

## 2024-03-09 ENCOUNTER — Ambulatory Visit
Admission: RE | Admit: 2024-03-09 | Discharge: 2024-03-09 | Disposition: A | Discharge status: Home / Self Care | Source: Ambulatory Visit | Attending: Nurse Practitioner | Admitting: Nurse Practitioner

## 2024-03-09 DIAGNOSIS — Z1231 Encounter for screening mammogram for malignant neoplasm of breast: Secondary | ICD-10-CM

## 2024-03-09 DIAGNOSIS — Z0000 Encounter for general adult medical examination without abnormal findings: Secondary | ICD-10-CM

## 2024-04-04 ENCOUNTER — Other Ambulatory Visit: Payer: Self-pay | Admitting: Nurse Practitioner

## 2024-04-04 ENCOUNTER — Encounter: Payer: Self-pay | Admitting: Nurse Practitioner

## 2024-04-04 DIAGNOSIS — N644 Mastodynia: Secondary | ICD-10-CM

## 2024-04-10 ENCOUNTER — Encounter: Payer: Self-pay | Admitting: Family Medicine

## 2024-04-10 ENCOUNTER — Other Ambulatory Visit (HOSPITAL_COMMUNITY)
Admission: RE | Admit: 2024-04-10 | Discharge: 2024-04-10 | Disposition: A | Discharge status: Home / Self Care | Source: Ambulatory Visit | Attending: Family Medicine | Admitting: Family Medicine

## 2024-04-10 ENCOUNTER — Ambulatory Visit: Admitting: Family Medicine

## 2024-04-10 VITALS — BP 107/71 | HR 61 | Wt 206.0 lb

## 2024-04-10 DIAGNOSIS — R232 Flushing: Secondary | ICD-10-CM

## 2024-04-10 DIAGNOSIS — E010 Iodine-deficiency related diffuse (endemic) goiter: Secondary | ICD-10-CM

## 2024-04-10 DIAGNOSIS — N939 Abnormal uterine and vaginal bleeding, unspecified: Secondary | ICD-10-CM

## 2024-04-10 DIAGNOSIS — Z113 Encounter for screening for infections with a predominantly sexual mode of transmission: Secondary | ICD-10-CM | POA: Diagnosis not present

## 2024-04-10 DIAGNOSIS — Z124 Encounter for screening for malignant neoplasm of cervix: Secondary | ICD-10-CM

## 2024-04-10 DIAGNOSIS — Z01419 Encounter for gynecological examination (general) (routine) without abnormal findings: Secondary | ICD-10-CM

## 2024-04-10 MED ORDER — NORETHINDRONE ACETATE 5 MG PO TABS
5.0000 mg | ORAL_TABLET | Freq: Every day | ORAL | 3 refills | Status: DC
Start: 2024-04-10 — End: 2024-08-28

## 2024-04-10 NOTE — Assessment & Plan Note (Signed)
 00795 - doubt peri-menopause. Check FSH. TFTs have been abnormal so will check these.

## 2024-04-10 NOTE — Assessment & Plan Note (Signed)
 00795 - consider scheduling prior missed u/s.

## 2024-04-10 NOTE — Progress Notes (Signed)
 CC: Headaches with periods     Patient presents for Annual.  LMP: No LMP recorded.  Last pap: Unsure  Contraception: Tubal ligation Mammogram: Getting Thursday  STD Screening: Accepts Flu Vaccine : N/A  CC: Migraines with cycles comes before and after periods, having hot flashes  Painful intercourse  Denies any vaginal dryness    Has strong family history of breast cancer

## 2024-04-10 NOTE — Patient Instructions (Signed)

## 2024-04-10 NOTE — Assessment & Plan Note (Addendum)
 00795 - Requests definitive treatment. Failed IUD in the past. Will move to endometrial ablation. Discussed hysterectomy briefly. Trial of Aygestin to thin lining.Risks include but are not limited to bleeding, infection, injury to surrounding structures, including bowel, bladder and ureters, blood clots, and death.  Likelihood of success is high.

## 2024-04-10 NOTE — Progress Notes (Signed)
 Subjective:     Tiffany Welch is a 38 y.o. female and is here for a comprehensive physical exam. The patient reports problems - heavy bleeding.cycles are regular and last 4 days and are heavy.. Has migraines prior to cycles and sometimes after. Having hot flashes again.      The following portions of the patient's history were reviewed and updated as appropriate: allergies, current medications, past family history, past medical history, past social history, past surgical history, and problem list.  Review of Systems Pertinent items noted in HPI and remainder of comprehensive ROS otherwise negative.   Objective:  Chaperone present for exam   BP 107/71   Pulse 61   Wt 206 lb (93.4 kg)   BMI 34.28 kg/m  General appearance: alert, cooperative, and appears stated age Head: Normocephalic, without obvious abnormality, atraumatic Neck: no adenopathy, supple, symmetrical, trachea midline, and enlarged thyroid  diffusely Lungs: clear to auscultation bilaterally Breasts: normal appearance, no masses or tenderness Heart: regular rate and rhythm, S1, S2 normal, no murmur, click, rub or gallop Abdomen: soft, non-tender; bowel sounds normal; no masses,  no organomegaly Pelvic: cervix normal in appearance, external genitalia normal, no adnexal masses or tenderness, no cervical motion tenderness, uterus normal size, shape, and consistency, and vagina normal without discharge Extremities: extremities normal, atraumatic, no cyanosis or edema Pulses: 2+ and symmetric Skin: Skin color, texture, turgor normal. No rashes or lesions Lymph nodes: Cervical, supraclavicular, and axillary nodes normal. Neurologic: Grossly normal    Assessment:    Healthy female exam.      Plan:   Problem List Items Addressed This Visit       Unprioritized   Abnormal uterine bleeding   00795 - Requests definitive treatment. Failed IUD in the past. Will move to endometrial ablation. Discussed hysterectomy briefly.  Trial of Aygestin to thin lining.Risks include but are not limited to bleeding, infection, injury to surrounding structures, including bowel, bladder and ureters, blood clots, and death.  Likelihood of success is high.       Relevant Medications   norethindrone (AYGESTIN) 5 MG tablet   Other Relevant Orders   Ambulatory Referral For Surgery Scheduling   Hot flashes   [phone] - doubt peri-menopause. Check FSH. TFTs have been abnormal so will check these.      Relevant Orders   TSH   T3, free   T4, free   Follicle stimulating hormone   VITAMIN D  25 Hydroxy (Vit-D Deficiency, Fractures)   Other Visit Diagnoses       Screening for malignant neoplasm of cervix    -  Primary   Relevant Orders   Cytology - PAP     Encounter for gynecological examination without abnormal finding         Screen for STD (sexually transmitted disease)       Relevant Orders   RPR+HBsAg+HCVAb+...         See After Visit Summary for Counseling Recommendations

## 2024-04-11 ENCOUNTER — Encounter: Admitting: Certified Nurse Midwife

## 2024-04-11 ENCOUNTER — Ambulatory Visit: Payer: Self-pay | Admitting: Family Medicine

## 2024-04-11 ENCOUNTER — Encounter: Payer: Self-pay | Admitting: Family Medicine

## 2024-04-11 ENCOUNTER — Encounter: Payer: Self-pay | Admitting: *Deleted

## 2024-04-11 LAB — FOLLICLE STIMULATING HORMONE: FSH: 7.1 m[IU]/mL

## 2024-04-11 LAB — T4, FREE: Free T4: 1.24 ng/dL (ref 0.82–1.77)

## 2024-04-11 LAB — RPR+HBSAG+HCVAB+...
HIV Screen 4th Generation wRfx: NONREACTIVE
Hep C Virus Ab: NONREACTIVE
Hepatitis B Surface Ag: NEGATIVE
RPR Ser Ql: NONREACTIVE

## 2024-04-11 LAB — T3, FREE: T3, Free: 3.3 pg/mL (ref 2.0–4.4)

## 2024-04-11 LAB — VITAMIN D 25 HYDROXY (VIT D DEFICIENCY, FRACTURES): Vit D, 25-Hydroxy: 23.7 ng/mL — ABNORMAL LOW (ref 30.0–100.0)

## 2024-04-11 LAB — TSH: TSH: 0.418 u[IU]/mL — ABNORMAL LOW (ref 0.450–4.500)

## 2024-04-12 ENCOUNTER — Encounter: Payer: Self-pay | Admitting: Nurse Practitioner

## 2024-04-12 ENCOUNTER — Telehealth: Payer: Self-pay

## 2024-04-12 ENCOUNTER — Ambulatory Visit
Admission: RE | Admit: 2024-04-12 | Discharge: 2024-04-12 | Disposition: A | Discharge status: Home / Self Care | Source: Ambulatory Visit | Attending: Nurse Practitioner | Admitting: Nurse Practitioner

## 2024-04-12 DIAGNOSIS — N644 Mastodynia: Secondary | ICD-10-CM | POA: Diagnosis not present

## 2024-04-12 DIAGNOSIS — E010 Iodine-deficiency related diffuse (endemic) goiter: Secondary | ICD-10-CM

## 2024-04-12 NOTE — Telephone Encounter (Signed)
 Would you like her to still get this done.   Copied from CRM [id number]. Topic: Clinical - Request for Lab/Test Order >> Apr 12, 2024 12:50 PM Selinda RAMAN wrote: Reason for CRM: Jacques from the Radiology/U/S Dept at [facility] called stating the patient is requesting to get the U/S Thyroid  done but the referral or order has closed. He is assuming because the original order was back in February. If the provider still wants this done please send new order to [phone] and Jacques can be reached at [phone]

## 2024-04-12 NOTE — Addendum Note (Signed)
 Addended by:  Ord, CAMIE E on: 04/12/2024 07:16 PM   Modules accepted: Orders

## 2024-04-13 LAB — CYTOLOGY - PAP
Chlamydia: NEGATIVE
Comment: NEGATIVE
Comment: NEGATIVE
Comment: NEGATIVE
Comment: NORMAL
Diagnosis: NEGATIVE
High risk HPV: NEGATIVE
Neisseria Gonorrhea: NEGATIVE
Trichomonas: NEGATIVE

## 2024-04-14 ENCOUNTER — Ambulatory Visit (HOSPITAL_BASED_OUTPATIENT_CLINIC_OR_DEPARTMENT_OTHER)
Admission: RE | Admit: 2024-04-14 | Discharge: 2024-04-14 | Disposition: A | Discharge status: Home / Self Care | Source: Ambulatory Visit | Attending: Nurse Practitioner | Admitting: Nurse Practitioner

## 2024-04-14 DIAGNOSIS — E010 Iodine-deficiency related diffuse (endemic) goiter: Secondary | ICD-10-CM | POA: Diagnosis not present

## 2024-04-14 DIAGNOSIS — E041 Nontoxic single thyroid nodule: Secondary | ICD-10-CM | POA: Diagnosis not present

## 2024-05-09 ENCOUNTER — Ambulatory Visit: Payer: Self-pay | Admitting: Nurse Practitioner

## 2024-05-16 ENCOUNTER — Telehealth: Payer: Self-pay

## 2024-05-16 NOTE — Telephone Encounter (Signed)
 I called patient to see if she's available for surgery w/ Dr. Fredirick on 05/29/24 at [facility] at 1;30 pm? I left a detailed voicemail and asked patient to call me to schedule at [phone]

## 2024-05-17 ENCOUNTER — Telehealth: Payer: Self-pay

## 2024-05-17 NOTE — Telephone Encounter (Signed)
 The patient left me a voicemail to confirm that she is not available for surgery w/ Dr. Fredirick on 05/29/24. I returned patient's call, and left a second voicemail asking if she's available on 06/15/24 at 10:15 am.

## 2024-05-22 ENCOUNTER — Telehealth: Payer: 59 | Admitting: Nurse Practitioner

## 2024-05-26 ENCOUNTER — Other Ambulatory Visit: Payer: Self-pay

## 2024-05-26 ENCOUNTER — Encounter (HOSPITAL_BASED_OUTPATIENT_CLINIC_OR_DEPARTMENT_OTHER): Payer: Self-pay | Admitting: Emergency Medicine

## 2024-05-26 DIAGNOSIS — J069 Acute upper respiratory infection, unspecified: Secondary | ICD-10-CM | POA: Diagnosis not present

## 2024-05-26 DIAGNOSIS — J029 Acute pharyngitis, unspecified: Secondary | ICD-10-CM | POA: Diagnosis present

## 2024-05-26 DIAGNOSIS — B9789 Other viral agents as the cause of diseases classified elsewhere: Secondary | ICD-10-CM | POA: Diagnosis not present

## 2024-05-26 LAB — RESP PANEL BY RT-PCR (RSV, FLU A&B, COVID)  RVPGX2
Influenza A by PCR: NEGATIVE
Influenza B by PCR: NEGATIVE
Resp Syncytial Virus by PCR: NEGATIVE
SARS Coronavirus 2 by RT PCR: NEGATIVE

## 2024-05-26 LAB — GROUP A STREP BY PCR: Group A Strep by PCR: NOT DETECTED

## 2024-05-26 NOTE — ED Triage Notes (Signed)
 On Thursday she began to have a sore throat and by Friday she has achy joints, nasal congestions and some headaches.  Pt denies fevers, N/V/D.  Pt took motrin  PTA

## 2024-05-27 ENCOUNTER — Emergency Department (HOSPITAL_BASED_OUTPATIENT_CLINIC_OR_DEPARTMENT_OTHER)
Admission: EM | Admit: 2024-05-27 | Discharge: 2024-05-27 | Disposition: A | Discharge status: Home / Self Care | Attending: Emergency Medicine | Admitting: Emergency Medicine

## 2024-05-27 DIAGNOSIS — J069 Acute upper respiratory infection, unspecified: Secondary | ICD-10-CM

## 2024-05-27 NOTE — ED Provider Notes (Signed)
   [place] EMERGENCY DEPARTMENT AT [facility]  Provider Note  CSN: [id number] Arrival date & time: 05/26/24 2115  History Chief Complaint  Patient presents with   Sore Throat    Tiffany Welch is a 38 y.o. female here for 2-3 days of URI symptoms. No fever.    Home Medications Prior to Admission medications   Medication Sig Start Date End Date Taking? Authorizing Provider  ALPRAZolam  (XANAX ) 0.25 MG tablet Take 1/2 to 1 tab by mouth up to twice a day as needed for panic symptoms. Patient not taking: Reported on 04/10/2024 11/22/23   Oris Camie BRAVO, NP  norethindrone  (AYGESTIN ) 5 MG tablet Take 1 tablet (5 mg total) by mouth daily. 04/10/24   Fredirick Glenys RAMAN, MD  sertraline  (ZOLOFT ) 25 MG tablet Take 1 tablet (25 mg total) by mouth daily. 11/22/23   Early, Sara E, NP     Allergies    Codeine and Doxycycline   Review of Systems   Review of Systems Please see HPI for pertinent positives and negatives  Physical Exam BP 119/71 (BP Location: Right Arm)   Pulse 63   Temp 98.5 F (36.9 C) (Oral)   Resp 18   SpO2 98%   Physical Exam Vitals and nursing note reviewed.  Constitutional:      Appearance: Normal appearance.  HENT:     Head: Normocephalic and atraumatic.     Nose: Nose normal.     Mouth/Throat:     Mouth: Mucous membranes are moist.     Pharynx: Uvula midline. Posterior oropharyngeal erythema present. No pharyngeal swelling or oropharyngeal exudate.  Eyes:     Extraocular Movements: Extraocular movements intact.     Conjunctiva/sclera: Conjunctivae normal.  Cardiovascular:     Rate and Rhythm: Normal rate.  Pulmonary:     Effort: Pulmonary effort is normal.     Breath sounds: Normal breath sounds.  Abdominal:     General: Abdomen is flat.     Palpations: Abdomen is soft.     Tenderness: There is no abdominal tenderness.  Musculoskeletal:        General: No swelling. Normal range of motion.     Cervical back: Neck supple.  Lymphadenopathy:      Cervical: No cervical adenopathy.  Skin:    General: Skin is warm and dry.  Neurological:     General: No focal deficit present.     Mental Status: She is alert.  Psychiatric:        Mood and Affect: Mood normal.     ED Results / Procedures / Treatments   EKG None  Procedures Procedures  Medications Ordered in the ED Medications - No data to display  Initial Impression and Plan  Patient here for 2-3 days of mild URI symptoms. Covid/Flu/RSV and strep swabs negative. Recommend supportive care, oral hydration, rest. PCP follow up, RTED for any other concerns.    ED Course       MDM Rules/Calculators/A&P Medical Decision Making Problems Addressed: Viral URI: acute illness or injury  Amount and/or Complexity of Data Reviewed Labs: ordered. Decision-making details documented in ED Course.  Risk OTC drugs.     Final Clinical Impression(s) / ED Diagnoses Final diagnoses:  Viral URI    Rx / DC Orders ED Discharge Orders     None        Roselyn Carlin NOVAK, MD 05/27/24 [phone]

## 2024-05-28 ENCOUNTER — Encounter: Payer: Self-pay | Admitting: Family Medicine

## 2024-05-28 ENCOUNTER — Ambulatory Visit: Admitting: Family Medicine

## 2024-05-28 VITALS — BP 126/78 | HR 96 | Temp 98.9°F | Ht 65.0 in | Wt 199.2 lb

## 2024-05-28 DIAGNOSIS — J0110 Acute frontal sinusitis, unspecified: Secondary | ICD-10-CM

## 2024-05-28 MED ORDER — FLUCONAZOLE 150 MG PO TABS
ORAL_TABLET | ORAL | 0 refills | Status: DC
Start: 2024-05-28 — End: 2024-08-28

## 2024-05-28 MED ORDER — AMOXICILLIN-POT CLAVULANATE 875-125 MG PO TABS: 1.0000 | ORAL_TABLET | Freq: Two times a day (BID) | ORAL | 0 refills | Status: DC

## 2024-05-28 NOTE — Patient Instructions (Signed)
 You may have a bacterial sinus infection causing swelling around your right eye and mild eye redness. -Take Augmentin  as prescribed for the bacterial sinusitis. -Use a neti pot for sinus rinses up to twice daily with distilled or boiled water. -Take pseudoephedrine for decongestion, available behind the counter. -Use Mucinex (guaifenesin), preferably the 12-hour formulation, to help thin out the mucus. -Stay well hydrated. -If you develop symptoms of a yeast infection, take Diflucan  and repeat a week later if needed. -Consider taking probiotics to help with potential gastrointestinal side effects from Augmentin . you may also use imodium for diarrhea, if needed.  -Use ibuprofen  for pain management, especially for muscle tension headaches. You can use ice or heat to the temple for headaches as well. -Consider taking Tylenol if you need additional pain relief.

## 2024-05-28 NOTE — Progress Notes (Addendum)
 Chief Complaint  Patient presents with   other    Sore throat Thursday, Friday chills Cough congestion, headache more on the right side, woke up this morning with eye swollen and pink. When she blowed her nose this morning she could feel something as if it was a strain around her right eye. Her mom passed away a couple of weeks back.Tested negative for covid, rsv and flu at hospital.    Tiffany Welch is a 38 year old female who presents with severe headache and right eye swelling following recent cold symptoms.  Symptoms began with a sore throat on Thursday (8/7), progressing to congestion and body aches by Friday. She visited the emergency room on Saturday night and tested negative for strep, RSV, flu, and COVID, with a likely viral infection diagnosis.  On Sunday night, she experienced a severe headache in the right temple, intense enough to wake her from sleep. Motrin  provided some relief for body aches and headaches since Friday. The headache on Sunday night was particularly severe, and she attempted to alleviate it with a cold rag and by changing her sleeping position.  Upon waking on Monday morning, she noticed right eye swelling and felt heavy pressure on the right side of her head. The pressure is around the right temple and behind the eye, without extending into the cheek. No vision changes, crusting, watering of the eye, or light sensitivity. Currently she describes a slight strain in head, but not as severe of a pain as last night. It really hurt to blow nose, caused similar pain at R side of head/temple. Pain is worse with leaning forwards  She has some clear nasal discharge with occasional yellowish mucus and a cough primarily due to post-nasal drainage, productive of yellow phlegm. She denies using decongestants or allergy  medications. Intermittent ear plugging occurs without pain, and she has experienced nausea without vomiting or diarrhea.  She has used motrin  for myalgias since  Friday, and for slight headaches, which had resolved (prior to severe HA Sunday). Has delsym and neti-pot at home, hasn't used with this illness.  PMH, PSH, SH reviewed  Her medical history includes anxiety, panic, vitamin D  deficiency, abnormal uterine bleeding, and menstrual migraines. She is taking norethindrone  for menstrual migraines and abnormal uterine bleeding. S/p BTL  Outpatient Encounter Medications as of 05/28/2024  Medication Sig   ALPRAZolam  (XANAX ) 0.25 MG tablet Take 1/2 to 1 tab by mouth up to twice a day as needed for panic symptoms.   norethindrone  (AYGESTIN ) 5 MG tablet Take 1 tablet (5 mg total) by mouth daily.   sertraline  (ZOLOFT ) 25 MG tablet Take 1 tablet (25 mg total) by mouth daily. (Patient not taking: Reported on 05/28/2024)   No facility-administered encounter medications on file as of 05/28/2024.   Allergies  Allergen Reactions   Codeine Hives   Doxycycline Swelling     ROS: No fever Chills, resolved (occ/mild) No SOB, wheezing. Some hoarseness off and on Some intermittent plugging of ears, no pain. Some nausea x 2d, no vomiting or diarrhea. No CP. See HPI    PHYSICAL EXAM:  BP 126/78   Pulse 96   Temp 98.9 F (37.2 C)   Ht 5' 5 (1.651 m)   Wt 199 lb 3.2 oz (90.4 kg)   LMP 05/07/2024   SpO2 96%   BMI 33.15 kg/m   Mildly ill-appearing female in no distress HEENT: conjunctiva is mildly injected on the right, with some STS of R eyelid.  Normal on the left.  PERRL, EOMI. TM's are obscured by cerumen bilaterally Nose--Thick yellow d/c in L nares. R mild edema, no purulence Tender at R frontal sinus and R temporalis muscle. Nontender at maxillary sinuses. OP clear, moist mucus membranes Neck: No lymphadenopathy Heart: regular rate and rhythm Lungs: clear bilaterally Neuro: alert and oriented. Cranial nerves grossly intact, normal gait Psych: normal mood, affect, hygiene and grooming    ASSESSMENT/PLAN:  Acute non-recurrent  frontal sinusitis - poss progression of viral URI to bacterial sinusitis, with purulent drainage, worsening sinus pain and eye swelling - Plan: amoxicillin -clavulanate (AUGMENTIN ) 875-125 MG tablet  DDx reviewed with patient--viral illness with poss early conjunctivitis (no drainage yet, part of viral syndrome), vs sinus infection causing the swelling and pain.  Will treat for sinus infection with Augmentin . Diflucan  prescribed as she typically gets yeast infection when taking antibiotics. Risks/SE reviewed. Precautions for when to return (fever, worsening swelling, HA, vomiting, etc) reviewed with patient.   Acute frontal sinusitis with right periorbital swelling and right eye conjunctival injection Acute frontal sinusitis with right periorbital swelling and mild conjunctival injection suggests bacterial sinusitis due to periorbital swelling and absence of typical viral conjunctivitis symptoms. - Prescribed Augmentin  for bacterial sinusitis. - Advised sinus rinses with neti pot twice daily using distilled or boiled water. - Recommended pseudoephedrine for decongestion, available behind the counter. - Suggested Mucinex for mucus expectoration, preferably the 12-hour formulation. - Advised Diflucan  for yeast infection if symptoms develop, with a second dose a week later if needed. - Encouraged staying well hydrated to thin mucus. - Discussed potential side effects of Augmentin , including diarrhea and yeast infection, and recommended probiotics to mitigate gastrointestinal side effects.  Viral upper respiratory infection Symptoms consistent with viral etiology, but bacterial sinusitis suspected due to periorbital swelling. - Continue supportive care with hydration and over-the-counter medications as needed. - Use ibuprofen  for pain management, particularly for muscle tension headaches. - Consider Tylenol if additional pain relief is needed.  Menstrual migraines Menstrual migraines managed with  norethindrone . No current migraine symptoms reported. - Continue norethindrone  as prescribed for menstrual migraine management.  Recording duration: 29 minutes Total time FTF and completion of documentation 36 mins

## 2024-06-02 ENCOUNTER — Other Ambulatory Visit: Payer: Self-pay

## 2024-06-02 ENCOUNTER — Emergency Department (HOSPITAL_BASED_OUTPATIENT_CLINIC_OR_DEPARTMENT_OTHER): Admission: EM | Admit: 2024-06-02 | Discharge: 2024-06-02 | Disposition: A | Discharge status: Home / Self Care

## 2024-06-02 ENCOUNTER — Emergency Department (HOSPITAL_BASED_OUTPATIENT_CLINIC_OR_DEPARTMENT_OTHER)

## 2024-06-02 ENCOUNTER — Encounter (HOSPITAL_BASED_OUTPATIENT_CLINIC_OR_DEPARTMENT_OTHER): Payer: Self-pay

## 2024-06-02 DIAGNOSIS — R42 Dizziness and giddiness: Secondary | ICD-10-CM | POA: Diagnosis not present

## 2024-06-02 DIAGNOSIS — R509 Fever, unspecified: Secondary | ICD-10-CM | POA: Diagnosis not present

## 2024-06-02 DIAGNOSIS — R0902 Hypoxemia: Secondary | ICD-10-CM | POA: Diagnosis not present

## 2024-06-02 DIAGNOSIS — J069 Acute upper respiratory infection, unspecified: Secondary | ICD-10-CM | POA: Insufficient documentation

## 2024-06-02 LAB — CBC
HCT: 39.6 % (ref 36.0–46.0)
Hemoglobin: 13.6 g/dL (ref 12.0–15.0)
MCH: 30.6 pg (ref 26.0–34.0)
MCHC: 34.3 g/dL (ref 30.0–36.0)
MCV: 89.2 fL (ref 80.0–100.0)
Platelets: 268 K/uL (ref 150–400)
RBC: 4.44 MIL/uL (ref 3.87–5.11)
RDW: 12.3 % (ref 11.5–15.5)
WBC: 5.5 K/uL (ref 4.0–10.5)
nRBC: 0 % (ref 0.0–0.2)

## 2024-06-02 LAB — URINALYSIS, ROUTINE W REFLEX MICROSCOPIC
Bilirubin Urine: NEGATIVE
Glucose, UA: NEGATIVE mg/dL
Ketones, ur: NEGATIVE mg/dL
Nitrite: NEGATIVE
Protein, ur: NEGATIVE mg/dL
Specific Gravity, Urine: 1.008 (ref 1.005–1.030)
pH: 8.5 — ABNORMAL HIGH (ref 5.0–8.0)

## 2024-06-02 LAB — COMPREHENSIVE METABOLIC PANEL WITH GFR
ALT: 6 U/L (ref 0–44)
AST: 18 U/L (ref 15–41)
Albumin: 4.6 g/dL (ref 3.5–5.0)
Alkaline Phosphatase: 83 U/L (ref 38–126)
Anion gap: 17 — ABNORMAL HIGH (ref 5–15)
BUN: 6 mg/dL (ref 6–20)
CO2: 17 mmol/L — ABNORMAL LOW (ref 22–32)
Calcium: 9.8 mg/dL (ref 8.9–10.3)
Chloride: 103 mmol/L (ref 98–111)
Creatinine, Ser: 0.83 mg/dL (ref 0.44–1.00)
GFR, Estimated: >60 mL/min (ref >60)
Glucose, Bld: 116 mg/dL — ABNORMAL HIGH (ref 70–99)
Potassium: 3.5 mmol/L (ref 3.5–5.1)
Sodium: 136 mmol/L (ref 135–145)
Total Bilirubin: 0.7 mg/dL (ref 0.0–1.2)
Total Protein: 7.8 g/dL (ref 6.5–8.1)

## 2024-06-02 LAB — RESP PANEL BY RT-PCR (RSV, FLU A&B, COVID)  RVPGX2
Influenza A by PCR: NEGATIVE
Influenza B by PCR: NEGATIVE
Resp Syncytial Virus by PCR: NEGATIVE
SARS Coronavirus 2 by RT PCR: NEGATIVE

## 2024-06-02 LAB — PREGNANCY, URINE: Preg Test, Ur: NEGATIVE

## 2024-06-02 LAB — CBG MONITORING, ED: Glucose-Capillary: 102 mg/dL — ABNORMAL HIGH (ref 70–99)

## 2024-06-02 MED ORDER — SODIUM CHLORIDE 0.9 % IV BOLUS
1000.0000 mL | Freq: Once | INTRAVENOUS | Status: AC
  Administered 2024-06-02: 1000 mL via INTRAVENOUS

## 2024-06-02 NOTE — ED Triage Notes (Signed)
 Pt BIB [facility] was seen here on Saturday fir same s/s dizziness and weakness, nasal congestion, body aches (all negative for Flu/covid) - Dx was URI.  Pt has gotten worse.  Pt was placed on aumentin by PCP for 4 days d/t eye swelling.   BP 130/80 HR 86 RR 24 O2 92 on RA - 2L = 99% CBG 144

## 2024-06-02 NOTE — Discharge Instructions (Addendum)
 Please continue stay well-hydrated.  There is no evidence of pneumonia at this time based off of your x-ray.  You continue with the Augmentin  that your PCP prescribed.  If you have any, worsening fever or chills please come into the ED.   If you ever experience any kind of chest pain or shortness of breath and please come back to the ED.   Please follow-up with your PCP.

## 2024-06-02 NOTE — ED Provider Notes (Signed)
 [place] EMERGENCY DEPARTMENT AT [facility] Provider Note   CSN: [id number] Arrival date & time: 06/02/24  2130     Patient presents with: Dizziness   Tiffany Welch is a 38 y.o. female.    Dizziness   Patient presents because of episode of dizziness.  Patient states that she woke up from a nap today, was accompanied by a fever.  Stood up and subsequent felt dizzy.  Subsequently feeling at baseline now.  Patient states has been having runny nose over the past week.  She has been fighting what she thinks is a viral illness for the past week   Previous medical history reviewed : Patient was seen in the ED on August 10.  Were seen because of sore throat.  COVID RSV and flu strep swabs negative.  Recommended supportive care.  Patient was seen by PCP on August 11.  PCP started patient on antibiotics due to concern for acute nonrecurrent frontal sinusitis.  Concern for possible progression of URI to bacterial sinusitis.  Started on Augmentin .     Prior to Admission medications   Medication Sig Start Date End Date Taking? Authorizing Provider  ALPRAZolam  (XANAX ) 0.25 MG tablet Take 1/2 to 1 tab by mouth up to twice a day as needed for panic symptoms. 11/22/23   Early, Sara E, NP  amoxicillin -clavulanate (AUGMENTIN ) 875-125 MG tablet Take 1 tablet by mouth 2 (two) times daily. 05/28/24   Randol Dawes, MD  fluconazole  (DIFLUCAN ) 150 MG tablet Take 1 tablet by mouth for yeast infection.  Repeat in 1 week if needed 05/28/24   Randol Dawes, MD  norethindrone  (AYGESTIN ) 5 MG tablet Take 1 tablet (5 mg total) by mouth daily. 04/10/24   Fredirick Glenys RAMAN, MD  sertraline  (ZOLOFT ) 25 MG tablet Take 1 tablet (25 mg total) by mouth daily. Patient not taking: Reported on 05/28/2024 11/22/23   Early, Sara E, NP    Allergies: Codeine and Doxycycline    Review of Systems  Neurological:  Positive for dizziness.    Updated Vital Signs BP 126/66   Pulse (!) 105   Temp 100 F (37.8 C) (Oral)   Resp (!)  22   Ht 5' 5 (1.651 m)   Wt 90.4 kg   LMP 05/07/2024   SpO2 100%   BMI 33.15 kg/m   Physical Exam Vitals and nursing note reviewed.  Constitutional:      General: She is not in acute distress.    Appearance: She is well-developed.  HENT:     Head: Normocephalic and atraumatic.  Eyes:     Conjunctiva/sclera: Conjunctivae normal.  Cardiovascular:     Rate and Rhythm: Normal rate and regular rhythm.     Heart sounds: No murmur heard. Pulmonary:     Effort: Pulmonary effort is normal. No respiratory distress.     Breath sounds: Normal breath sounds.  Abdominal:     Palpations: Abdomen is soft.     Tenderness: There is no abdominal tenderness.  Musculoskeletal:        General: No swelling.     Cervical back: Neck supple.  Skin:    General: Skin is warm and dry.     Capillary Refill: Capillary refill takes less than 2 seconds.  Neurological:     Mental Status: She is alert.  Psychiatric:        Mood and Affect: Mood normal.     (all labs ordered are listed, but only abnormal results are displayed) Labs Reviewed  COMPREHENSIVE METABOLIC  PANEL WITH GFR - Abnormal; Notable for the following components:      Result Value   CO2 17 (*)    Glucose, Bld 116 (*)    Anion gap 17 (*)    All other components within normal limits  URINALYSIS, ROUTINE W REFLEX MICROSCOPIC - Abnormal; Notable for the following components:   Color, Urine COLORLESS (*)    pH 8.5 (*)    Hgb urine dipstick SMALL (*)    Leukocytes,Ua TRACE (*)    Bacteria, UA RARE (*)    All other components within normal limits  CBG MONITORING, ED - Abnormal; Notable for the following components:   Glucose-Capillary 102 (*)    All other components within normal limits  RESP PANEL BY RT-PCR (RSV, FLU A&B, COVID)  RVPGX2  CBC  PREGNANCY, URINE    EKG: EKG Interpretation Date/Time:  Saturday June 02 2024 22:25:11 EDT Ventricular Rate:  97 PR Interval:  146 QRS Duration:  91 QT Interval:  358 QTC  Calculation: 455 R Axis:   44  Text Interpretation: Sinus rhythm Confirmed by Simon Rea [phone]) on 06/02/2024 10:38:36 PM  Radiology: [facility] Chest Portable 1 View Result Date: 06/02/2024 CLINICAL DATA:  Dizziness weakness EXAM: PORTABLE CHEST 1 VIEW COMPARISON:  01/31/2024 FINDINGS: The heart size and mediastinal contours are within normal limits. Both lungs are clear. The visualized skeletal structures are unremarkable. IMPRESSION: No active disease. Electronically Signed   By: Luke Bun M.D.   On: 06/02/2024 23:16     Procedures   Medications Ordered in the ED  sodium chloride  0.9 % bolus 1,000 mL (1,000 mLs Intravenous New Bag/Given 06/02/24 2218)                                    Medical Decision Making Amount and/or Complexity of Data Reviewed Labs: ordered. Radiology: ordered.     Previous medical history reviewed : Patient was seen in the ED on August 10.  Were seen because of sore throat.  COVID RSV and flu strep swabs negative.  Recommended supportive care.  Patient was seen by PCP on August 11.  PCP started patient on antibiotics due to concern for acute nonrecurrent frontal sinusitis.  Concern for possible progression of URI to bacterial sinusitis.  Started on Augmentin .    Upon exam, patient initially slightly tachycardic.  Responded well to fluid.  Heart rate improved to the 90s.  I think this is likely because of dehydration.  Do not eat or drink much today due to feeling ill.   No concerns for PE at this point time.  No previous history of DVT or PE.  No clear chest pain.  No hemoptysis.  Heart rate improved with fluid.  Seems to be more viral in nature.  Currently not on any kind of hormone replacement or birth control.  Recommend patient to come back to the ED if she ever experiences a, chest pain especially with deep breaths or hemoptysis.   Did obtain chest x-ray.  Unremarkable.  No evidence of superimposed bacterial pneumonia at this point time.  COVID  RSV and flu negative again.  Given patient's consolation symptoms, still likely to be viral in nature.   Patient remained on room air.  100% O2 saturation.  Heart rate in the 90s to high 80s after a liter of fluid.    Patient discharged stable condition. with strict return precautions  Final diagnoses:  Upper respiratory tract infection, unspecified type    ED Discharge Orders     None          Simon Lavonia SAILOR, MD 06/02/24 2334

## 2024-06-05 ENCOUNTER — Ambulatory Visit: Admitting: Neurology

## 2024-06-05 ENCOUNTER — Encounter: Payer: Self-pay | Admitting: Neurology

## 2024-06-05 ENCOUNTER — Ambulatory Visit: Payer: Self-pay

## 2024-06-05 NOTE — Telephone Encounter (Signed)
 FYI Only or Action Required?: Action required by provider: request for appointment.  Patient was last seen in primary care on 05/28/2024 by Randol Dawes, MD.  Called Nurse Triage reporting Sore Throat, Eye Drainage, Nasal Congestion, and Fever.  Symptoms began 2 weeks ago.  Interventions attempted: OTC medications: Motrin , Prescription medications: Augmentin , and Rest, hydration, or home remedies.  Symptoms are: unchanged.  Triage Disposition: See Physician Within 24 Hours  Patient/caregiver understands and will follow disposition?: Yes, but will wait                             Copied from CRM #[id number]. Topic: Clinical - Red Word Triage >> Jun 05, 2024  1:06 PM Kevelyn M wrote: Red Word that prompted transfer to Nurse Triage: Started feeling sick on 05/24/2024. Went to ER on this past Saturday COVID TEST/weekend (negative)before that tested COVID/RSV(negative). Still congested and now coughing. No appetite, has been drinking ensure. They can't find anything wrong. She feels light-headed and dizziness. She feels like something is being missed. Night sweats and sides are hurting now. Reason for Disposition  [1] Continuous (nonstop) coughing interferes with work or school AND [2] no improvement using cough treatment per Care Advice  Answer Assessment - Initial Assessment Questions 1. ONSET: When did the cough begin?      Sunday  2. SEVERITY: How bad is the cough today?      Coughing spells while on phone with this RN 3. SPUTUM: Describe the color of your sputum (e.g., none, dry cough; clear, white, yellow, green)     Thick and yellow  4. HEMOPTYSIS: Are you coughing up any blood? If Yes, ask: How much? (e.g., flecks, streaks, tablespoons, etc.)     Speck of blood this morning 5. DIFFICULTY BREATHING: Are you having difficulty breathing? If Yes, ask: How bad is it? (e.g., mild, moderate, severe)      States breathing seems to be normal, states she  feels SOB/fatigued after she walks up the stairs, patient able to speak in clear and complete sentences while on phone with this RN, no labored breathing detected 6. FEVER: Do you have a fever? If Yes, ask: What is your temperature, how was it measured, and when did it start?     Denies 7. CARDIAC HISTORY: Do you have any history of heart disease? (e.g., heart attack, congestive heart failure)      Denies 8. LUNG HISTORY: Do you have any history of lung disease?  (e.g., pulmonary embolus, asthma, emphysema)     Denies 10. OTHER SYMPTOMS: Do you have any other symptoms? (e.g., runny nose, wheezing, chest pain)       Sweating, fatigue, lack of appetite, nose bleed on Sunday morning, dizziness/lightheadedness- states this could be exacerbated by her anxiety, nasal congestion, denies sinus pain/pressure   Patient stated symptoms have been ongoing for 2 weeks. Patient stated she has already been to the ED x 2 for symptoms. Patient stated cough started over the weekend. Advised patient to be seen within 24 hours. Patient stated she only wanted to be seen by her PCP. Offered availability with other providers and patient declined. CAL on lunch at time of call. Please contact patient and advise on scheduling.  Protocols used: Cough - Acute Productive-A-AH

## 2024-06-05 NOTE — Progress Notes (Deleted)
 [facility]    Provider:  Dr Ines Requesting Provider: Trine Raynell Moder,* Primary Care Provider:  Oris Camie BRAVO, NP  CC:  ***  HPI:  Tiffany Welch is a 38 y.o. female here as requested by Trine Raynell Moder,* for left-sided weakness. has Encounter for annual physical exam; Panic disorder; Vitamin D  deficiency; Allergic rhinitis; Family history of breast cancer in first degree relative; Intractable menstrual migraine without status migrainosus; Generalized anxiety disorder; Thyromegaly; Left sided numbness; Abnormal uterine bleeding; and Hot flashes on their problem list.   Reviewed notes, labs and imaging from outside physicians, which showed ***  Review of Systems: Patient complains of symptoms per HPI as well as the following symptoms ***. Pertinent negatives and positives per HPI. All others negative.   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, customer service   Social Drivers of Health   Financial Resource Strain: Medium Risk (11/22/2023)   Overall Financial Resource Strain (CARDIA)    Difficulty of Paying Living Expenses: Somewhat hard  Food Insecurity: Food Insecurity Present (11/22/2023)   Hunger Vital Sign    Worried About Running Out of Food in the Last Year: Often true    Ran Out of Food in the Last Year: Often true  Transportation Needs: No Transportation Needs  (11/22/2023)   PRAPARE - Administrator, Civil Service (Medical): No    Lack of Transportation (Non-Medical): No  Physical Activity: Insufficiently Active (11/22/2023)   Exercise Vital Sign    Days of Exercise per Week: 7 days    Minutes of Exercise per Session: 10 min  Stress: Stress Concern Present (11/22/2023)   [employer] of Occupational Health - Occupational Stress Questionnaire    Feeling of Stress : Rather much  Social Connections: Moderately Integrated (11/22/2023)   Social Connection and Isolation Panel    Frequency of Communication with Friends and Family: Three times a week    Frequency of Social Gatherings with Friends and Family: Never    Attends Religious Services: More than 4 times per year    Active Member of [employer] or Organizations: Yes    Attends Engineer, structural: More than 4 times per year    Marital Status: Never married  Intimate Partner Violence: Not At Risk (11/22/2023)   Humiliation, Afraid, Rape, and Kick questionnaire    Fear of Current or Ex-Partner: No    Emotionally Abused: No    Physically Abused: No    Sexually Abused: No    Family History  Problem Relation Age of Onset   Breast cancer Mother 33   Breast cancer Sister 45    Past Medical History:  Diagnosis Date   Anxiety    Bacterial URI 07/15/2022   Exposure to the flu 10/06/2022   Non-recurrent acute suppurative otitis media of  left ear without spontaneous rupture of tympanic membrane 10/06/2022   Seasonal allergic rhinitis due to pollen 06/22/2022   Wellness examination 06/01/2022    Patient Active Problem List   Diagnosis Date Noted   Abnormal uterine bleeding 04/10/2024   Hot flashes 04/10/2024   Left sided numbness 02/01/2024   Family history of breast cancer in first degree relative 11/22/2023   Intractable menstrual migraine without status migrainosus 11/22/2023   Generalized anxiety disorder 11/22/2023   Thyromegaly 11/22/2023   Allergic rhinitis  07/02/2022   Vitamin D  deficiency 06/22/2022   Encounter for annual physical exam 06/01/2022   Panic disorder 06/01/2022    Past Surgical History:  Procedure Laterality Date   CHOLECYSTECTOMY     TUBAL LIGATION      Current Outpatient Medications  Medication Sig Dispense Refill   ALPRAZolam  (XANAX ) 0.25 MG tablet Take 1/2 to 1 tab by mouth up to twice a day as needed for panic symptoms. 20 tablet 2   amoxicillin -clavulanate (AUGMENTIN ) 875-125 MG tablet Take 1 tablet by mouth 2 (two) times daily. 20 tablet 0   fluconazole  (DIFLUCAN ) 150 MG tablet Take 1 tablet by mouth for yeast infection.  Repeat in 1 week if needed 2 tablet 0   norethindrone  (AYGESTIN ) 5 MG tablet Take 1 tablet (5 mg total) by mouth daily. 60 tablet 3   sertraline  (ZOLOFT ) 25 MG tablet Take 1 tablet (25 mg total) by mouth daily. (Patient not taking: Reported on 05/28/2024) 30 tablet 3   No current facility-administered medications for this visit.    Allergies as of 06/05/2024 - Review Complete 06/02/2024  Allergen Reaction Noted   Codeine Hives 09/25/2020   Doxycycline Swelling 09/25/2020    Vitals: LMP 05/07/2024  Last Weight:  Wt Readings from Last 1 Encounters:  06/02/24 199 lb 3.2 oz (90.4 kg)   Last Height:   Ht Readings from Last 1 Encounters:  06/02/24 5' 5 (1.651 m)     Physical exam: Exam: Gen: NAD, conversant, well nourised, obese, well groomed                     CV: RRR, no MRG. No Carotid Bruits. No peripheral edema, warm, nontender Eyes: Conjunctivae clear without exudates or hemorrhage  Neuro: Detailed Neurologic Exam  Speech:    Speech is normal; fluent and spontaneous with normal comprehension.  Cognition:    The patient is oriented to person, place, and time;     recent and remote memory intact;     language fluent;     normal attention, concentration,     fund of knowledge Cranial Nerves:    The pupils are equal, round, and reactive to light. The fundi are normal and  spontaneous venous pulsations are present. Visual fields are full to finger confrontation. Extraocular movements are intact. Trigeminal sensation is intact and the muscles of mastication are normal. The face is symmetric. The palate elevates in the midline. Hearing intact. Voice is normal. Shoulder shrug is normal. The tongue has normal motion without fasciculations.   Coordination:    Normal finger to nose and heel to shin. Normal rapid alternating movements.   Gait:    Heel-toe and tandem gait are normal.   Motor Observation:    No asymmetry, no atrophy, and no involuntary movements noted. Tone:    Normal muscle tone.    Posture:    Posture is normal. normal erect    Strength:    Strength is V/V in the upper and lower  limbs.      Sensation: intact to LT     Reflex Exam:  DTR's:    Deep tendon reflexes in the upper and lower extremities are normal bilaterally.   Toes:    The toes are downgoing bilaterally.   Clonus:    Clonus is absent.    Assessment/Plan:    No orders of the defined types were placed in this encounter.  No orders of the defined types were placed in this encounter.   Cc: Cardama, Raynell Norman DEWAINE Oris, Camie BRAVO, NP  Onetha Epp, MD  [facility] [address] [place], [place] [address]  Phone [phone] Fax [phone]

## 2024-06-05 NOTE — Telephone Encounter (Signed)
Tried to call patient and could not leave message.

## 2024-06-11 DIAGNOSIS — F4320 Adjustment disorder, unspecified: Secondary | ICD-10-CM | POA: Diagnosis not present

## 2024-06-21 ENCOUNTER — Encounter: Payer: Self-pay | Admitting: Nurse Practitioner

## 2024-07-17 ENCOUNTER — Ambulatory Visit: Payer: Self-pay

## 2024-07-17 ENCOUNTER — Ambulatory Visit: Admitting: Family Medicine

## 2024-07-17 NOTE — Telephone Encounter (Signed)
 Called patient to offer appt today, she did not answer.

## 2024-07-17 NOTE — Telephone Encounter (Signed)
 FYI Only or Action Required?: FYI only for provider.  Patient was last seen in primary care on 05/28/2024 by Randol Dawes, MD.  Called Nurse Triage reporting Constipation.  Symptoms began a week ago.  Interventions attempted: Nothing.  Symptoms are: unchanged.  Triage Disposition: See Physician Within 24 Hours  Patient/caregiver understands and will follow disposition?: Yes       Copied from CRM [id number]. Topic: Clinical - Red Word Triage >> Jul 17, 2024  8:08 AM Tiffany Welch wrote: Red Word that prompted transfer to Nurse Triage: Patient states she is constipated and has not had a bowel movement for a week. Has had really bad gas that seems to travel all over her body and at times she is nauseous. Reason for Disposition  Last bowel movement (BM) > 4 days ago  Answer Assessment - Initial Assessment Questions 1. STOOL PATTERN OR FREQUENCY: How often do you have a bowel movement (BM)?  (Normal range: 3 times a day to every 3 days)  When was your last BM?       Every other day, varies 2. STRAINING: Do you have to strain to have a BM?      denies 3. ONSET: When did the constipation begin?     X 1 week 4. RECTAL PAIN: Does your rectum hurt when the stool comes out? If Yes, ask: Do you have hemorrhoids? How bad is the pain?  (Scale 1-10; or mild, moderate, severe)     Hx of hemorrhoids 5. BM COMPOSITION: Are the stools hard?      Kinda little balls/pellets 6. BLOOD ON STOOLS: Has there been any blood on the toilet tissue or on the surface of the BM? If Yes, ask: When was the last time?     If I did have blood, it would come from my hemorrhoid 7. CHRONIC CONSTIPATION: Is this a new problem for you?  If No, ask: How long have you had this problem? (days, weeks, months)      *No Answer* 8. CHANGES IN DIET OR HYDRATION: Have there been any recent changes in your diet? How much fluids are you drinking on a daily basis?  How much have you had to drink  today?     I drink plenty. Endorses appetite changes x 1 year and supplements with Ensure. 9. MEDICINES: Have you been taking any new medicines? Are you taking any narcotic pain medicines? (e.g., Dilaudid, morphine, Percocet, Vicodin)     Denies, only endorses taking Motrin . 10. LAXATIVES: Have you been using any stool softeners, laxatives, or enemas?  If Yes, ask What are you using, how often, and when was the last time?       denies 11. ACTIVITY:  How much walking do you do every day?  Has your activity level decreased in the past week?        denies 12. CAUSE: What do you think is causing the constipation?        unknown 13. MEDICAL HISTORY: Do you have a history of hemorrhoids, rectal fissures, rectal surgery, or rectal abscess?         Hemorrhoids.  14. OTHER SYMPTOMS: Do you have any other symptoms? (e.g., abdomen pain, bloating, fever, vomiting)       Gas traveling throughout my body 15. PREGNANCY: Is there any chance you are pregnant? When was your last menstrual period?       N/a  Protocols used: Constipation-A-AH

## 2024-07-18 ENCOUNTER — Ambulatory Visit: Admitting: Family Medicine

## 2024-07-24 ENCOUNTER — Telehealth: Payer: Self-pay

## 2024-07-24 NOTE — Telephone Encounter (Signed)
 I called the patient to see if she's available for surgery w/ Dr. Fredirick on 08/21/24 at 9 am/[facility]. I left a voicemail requesting patient to call back to schedule.

## 2024-07-27 ENCOUNTER — Other Ambulatory Visit: Payer: Self-pay

## 2024-07-27 ENCOUNTER — Encounter (HOSPITAL_BASED_OUTPATIENT_CLINIC_OR_DEPARTMENT_OTHER): Payer: Self-pay

## 2024-07-27 ENCOUNTER — Emergency Department (HOSPITAL_BASED_OUTPATIENT_CLINIC_OR_DEPARTMENT_OTHER)
Admission: EM | Admit: 2024-07-27 | Discharge: 2024-07-28 | Disposition: A | Discharge status: Home / Self Care | Attending: Emergency Medicine | Admitting: Emergency Medicine

## 2024-07-27 DIAGNOSIS — R0789 Other chest pain: Secondary | ICD-10-CM | POA: Diagnosis not present

## 2024-07-27 DIAGNOSIS — D649 Anemia, unspecified: Secondary | ICD-10-CM | POA: Diagnosis not present

## 2024-07-27 NOTE — ED Triage Notes (Signed)
 Pt reports SOB the past couple of days along with heaviness in center of chest and upper back beginning today. Pt reports associated lightheadedness while at church earlier tonight - mostly subsided. Pt's also reports voice has been hoarse 1 month. SOB is primarily with exertion, no acute respiratory distress noted.  Denies fever, N/V/D.

## 2024-07-27 NOTE — ED Provider Notes (Signed)
 [place] [facility] Provider Note   CSN: [id number] Arrival date & time: 07/27/24  2306     Patient presents with: Shortness of Breath and Chest Pain   Tiffany Welch is a 38 y.o. female.   The history is provided by the patient.  Shortness of Breath Associated symptoms: chest pain   Chest Pain Associated symptoms: shortness of breath   Tiffany Welch is a 38 y.o. female who presents to the Emergency Department complaining of chest pain and difficulty breathing. She presents the emergency department for evaluation of symptoms that started several days ago with left sided dull discomfort under her left arm and breast. She does feel short of breath at times and feels like something is sitting on her chest and back. No associated fever, cough, abdominal pain, nausea, vomiting, leg swelling, sore throat. She does report meeting source for about a month. She has no known medical problems. Takes no oral contraceptives. No history of DVT/PE. No tobacco, alcohol, drug use.       Prior to Admission medications   Medication Sig Start Date End Date Taking? Authorizing Provider  ALPRAZolam  (XANAX ) 0.25 MG tablet Take 1/2 to 1 tab by mouth up to twice a day as needed for panic symptoms. 11/22/23   Early, Sara E, NP  amoxicillin -clavulanate (AUGMENTIN ) 875-125 MG tablet Take 1 tablet by mouth 2 (two) times daily. 05/28/24   Randol Dawes, MD  fluconazole  (DIFLUCAN ) 150 MG tablet Take 1 tablet by mouth for yeast infection.  Repeat in 1 week if needed 05/28/24   Randol Dawes, MD  norethindrone  (AYGESTIN ) 5 MG tablet Take 1 tablet (5 mg total) by mouth daily. 04/10/24   Fredirick Glenys RAMAN, MD  sertraline  (ZOLOFT ) 25 MG tablet Take 1 tablet (25 mg total) by mouth daily. Patient not taking: Reported on 05/28/2024 11/22/23   Oris Camie BRAVO, NP    Allergies: Codeine and Doxycycline    Review of Systems  Respiratory:  Positive for shortness of breath.   Cardiovascular:  Positive for  chest pain.  All other systems reviewed and are negative.   Updated Vital Signs BP (!) 100/59   Pulse 74   Temp 98.1 F (36.7 C) (Oral)   Resp 15   Ht 5' 5 (1.651 m)   Wt 86.2 kg   LMP  (LMP Unknown)   SpO2 97%   BMI 31.62 kg/m   Physical Exam Vitals and nursing note reviewed.  Constitutional:      Appearance: She is well-developed.  HENT:     Head: Normocephalic and atraumatic.  Cardiovascular:     Rate and Rhythm: Normal rate and regular rhythm.     Heart sounds: No murmur heard. Pulmonary:     Effort: Pulmonary effort is normal. No respiratory distress.     Breath sounds: Normal breath sounds.  Chest:     Chest wall: No tenderness.  Abdominal:     Palpations: Abdomen is soft.     Tenderness: There is no abdominal tenderness. There is no guarding or rebound.  Musculoskeletal:        General: No swelling or tenderness.  Skin:    General: Skin is warm and dry.  Neurological:     Mental Status: She is alert and oriented to person, place, and time.  Psychiatric:        Behavior: Behavior normal.     (all labs ordered are listed, but only abnormal results are displayed) Labs Reviewed  CBC WITH DIFFERENTIAL/PLATELET -  Abnormal; Notable for the following components:      Result Value   RBC 3.78 (*)    Hemoglobin 11.4 (*)    HCT 34.8 (*)    All other components within normal limits  BASIC METABOLIC PANEL WITH GFR  D-DIMER, QUANTITATIVE  HCG, SERUM, QUALITATIVE  TROPONIN T, HIGH SENSITIVITY    EKG: EKG Interpretation Date/Time:  Friday July 27 2024 23:13:30 EDT Ventricular Rate:  73 PR Interval:  139 QRS Duration:  79 QT Interval:  390 QTC Calculation: 430 R Axis:   45  Text Interpretation: Sinus arrhythmia Abnormal R-wave progression, early transition Confirmed by Griselda Norris [phone]) on 07/27/2024 11:15:36 PM  Radiology: DG Chest 2 View Result Date: 07/28/2024 CLINICAL DATA:  Left-sided chest pain EXAM: CHEST - 2 VIEW COMPARISON:  06/02/2024  FINDINGS: The heart size and mediastinal contours are within normal limits. Both lungs are clear. The visualized skeletal structures are unremarkable. IMPRESSION: No active cardiopulmonary disease. Electronically Signed   By: Oneil Devonshire M.D.   On: 07/28/2024 00:31     Procedures   Medications Ordered in the ED  ketorolac  (TORADOL ) 15 MG/ML injection 15 mg (15 mg Intravenous Given 07/28/24 0056)                                    Medical Decision Making Amount and/or Complexity of Data Reviewed Labs: ordered. Radiology: ordered.  Risk Prescription drug management.   Patient here for evaluation of left sided chest pain. EKG is not ischemic and troponin is negative. Chest x-ray is negative for acute infiltrate or abnormality - images personally reviewed and interpreted, agree with radiologist interpretation. CBC with mild anemia. BMP is within normal limits. D dimer is negative, patient is otherwise low risk. Current picture is not consistent with PE. Suspect patient has musculoskeletal chest pain. Discussed home care with outpatient follow-up and return precautions.     Final diagnoses:  Atypical chest pain  Anemia, unspecified type    ED Discharge Orders     None          Griselda Norris, MD 07/28/24 [phone]

## 2024-07-28 ENCOUNTER — Emergency Department (HOSPITAL_BASED_OUTPATIENT_CLINIC_OR_DEPARTMENT_OTHER)

## 2024-07-28 LAB — HCG, SERUM, QUALITATIVE: Preg, Serum: NEGATIVE

## 2024-07-28 LAB — D-DIMER, QUANTITATIVE: D-Dimer, Quant: <0.27 ug{FEU}/mL (ref 0.00–0.50)

## 2024-07-28 LAB — CBC WITH DIFFERENTIAL/PLATELET
Abs Immature Granulocytes: 0.01 K/uL (ref 0.00–0.07)
Basophils Absolute: 0 K/uL (ref 0.0–0.1)
Basophils Relative: 0 %
Eosinophils Absolute: 0.1 K/uL (ref 0.0–0.5)
Eosinophils Relative: 1 %
HCT: 34.8 % — ABNORMAL LOW (ref 36.0–46.0)
Hemoglobin: 11.4 g/dL — ABNORMAL LOW (ref 12.0–15.0)
Immature Granulocytes: 0 %
Lymphocytes Relative: 41 %
Lymphs Abs: 2.7 K/uL (ref 0.7–4.0)
MCH: 30.2 pg (ref 26.0–34.0)
MCHC: 32.8 g/dL (ref 30.0–36.0)
MCV: 92.1 fL (ref 80.0–100.0)
Monocytes Absolute: 0.4 K/uL (ref 0.1–1.0)
Monocytes Relative: 7 %
Neutro Abs: 3.4 K/uL (ref 1.7–7.7)
Neutrophils Relative %: 51 %
Platelets: 243 K/uL (ref 150–400)
RBC: 3.78 MIL/uL — ABNORMAL LOW (ref 3.87–5.11)
RDW: 13.1 % (ref 11.5–15.5)
WBC: 6.6 K/uL (ref 4.0–10.5)
nRBC: 0 % (ref 0.0–0.2)

## 2024-07-28 LAB — BASIC METABOLIC PANEL WITH GFR
Anion gap: 10 (ref 5–15)
BUN: 7 mg/dL (ref 6–20)
CO2: 25 mmol/L (ref 22–32)
Calcium: 9 mg/dL (ref 8.9–10.3)
Chloride: 105 mmol/L (ref 98–111)
Creatinine, Ser: 0.94 mg/dL (ref 0.44–1.00)
GFR, Estimated: >60 mL/min (ref >60)
Glucose, Bld: 99 mg/dL (ref 70–99)
Potassium: 3.5 mmol/L (ref 3.5–5.1)
Sodium: 140 mmol/L (ref 135–145)

## 2024-07-28 LAB — TROPONIN T, HIGH SENSITIVITY: Troponin T High Sensitivity: <15 ng/L (ref 0–19)

## 2024-07-28 MED ORDER — KETOROLAC TROMETHAMINE 15 MG/ML IJ SOLN
15.0000 mg | Freq: Once | INTRAMUSCULAR | Status: AC
  Administered 2024-07-28: 15 mg via INTRAVENOUS
  Filled 2024-07-28: qty 1

## 2024-07-28 NOTE — ED Notes (Signed)
 Patient ambulated to the bathroom independently.

## 2024-08-03 ENCOUNTER — Telehealth: Payer: Self-pay

## 2024-08-03 NOTE — Telephone Encounter (Signed)
 Patient called back. I provided her with Dr. Alonzo available surgery dates. She will speak with her support team concerning dates, and call me back to schedule.

## 2024-08-10 ENCOUNTER — Encounter: Payer: Self-pay | Admitting: Family Medicine

## 2024-08-23 ENCOUNTER — Encounter (HOSPITAL_COMMUNITY): Payer: Self-pay | Admitting: Family Medicine

## 2024-08-23 NOTE — Progress Notes (Signed)
 Spoke w/ via phone for pre-op interview--- [facility] needs dos----surgeon orders requested 08/23/24. UPT per anesthesia.         Lab results------Current EKG in Epic dated 07/27/24. COVID test -----patient states asymptomatic no test needed Arrive at -------1330 NPO after MN NO Solid Food.  Clear liquids from MN until---1230 Pre-Surgery Ensure or G2:  Med rec completed Medications to take morning of surgery ----- Xanax -PRN and Aygestin  Diabetic medication -----  GLP1 agonist last dose: GLP1 instructions:  Patient instructed no nail polish to be worn day of surgery Patient instructed to bring photo id and insurance card day of surgery Patient aware to have Driver (ride ) / caregiver    for 24 hours after surgery - Father Beryl Dustman Patient Special Instructions ----- Pre-Op special Instructions -----  Patient verbalized understanding of instructions that were given at this phone interview. Patient denies chest pain, sob, fever, cough at the interview.

## 2024-08-28 ENCOUNTER — Encounter (HOSPITAL_COMMUNITY)
Admission: RE | Disposition: A | Discharge status: Home / Self Care | Payer: Self-pay | Source: Home / Self Care | Attending: Family Medicine

## 2024-08-28 ENCOUNTER — Ambulatory Visit (HOSPITAL_COMMUNITY): Admitting: Anesthesiology

## 2024-08-28 ENCOUNTER — Encounter (HOSPITAL_COMMUNITY): Payer: Self-pay | Admitting: Family Medicine

## 2024-08-28 ENCOUNTER — Ambulatory Visit (HOSPITAL_COMMUNITY)
Admission: RE | Admit: 2024-08-28 | Discharge: 2024-08-28 | Disposition: A | Discharge status: Home / Self Care | Attending: Family Medicine | Admitting: Family Medicine

## 2024-08-28 DIAGNOSIS — N939 Abnormal uterine and vaginal bleeding, unspecified: Secondary | ICD-10-CM | POA: Diagnosis present

## 2024-08-28 DIAGNOSIS — N920 Excessive and frequent menstruation with regular cycle: Secondary | ICD-10-CM | POA: Diagnosis not present

## 2024-08-28 DIAGNOSIS — Z9851 Tubal ligation status: Secondary | ICD-10-CM | POA: Diagnosis not present

## 2024-08-28 DIAGNOSIS — Z7722 Contact with and (suspected) exposure to environmental tobacco smoke (acute) (chronic): Secondary | ICD-10-CM | POA: Insufficient documentation

## 2024-08-28 DIAGNOSIS — Z01818 Encounter for other preprocedural examination: Secondary | ICD-10-CM

## 2024-08-28 HISTORY — PX: HYSTEROSCOPY WITH D & C: SHX1775

## 2024-08-28 HISTORY — PX: HYSTEROSCOPY: SHX211

## 2024-08-28 LAB — CBC
HCT: 38.9 % (ref 36.0–46.0)
Hemoglobin: 12.9 g/dL (ref 12.0–15.0)
MCH: 30.9 pg (ref 26.0–34.0)
MCHC: 33.2 g/dL (ref 30.0–36.0)
MCV: 93.1 fL (ref 80.0–100.0)
Platelets: 268 K/uL (ref 150–400)
RBC: 4.18 MIL/uL (ref 3.87–5.11)
RDW: 13.4 % (ref 11.5–15.5)
WBC: 5.1 K/uL (ref 4.0–10.5)
nRBC: 0 % (ref 0.0–0.2)

## 2024-08-28 LAB — POCT PREGNANCY, URINE: Preg Test, Ur: NEGATIVE

## 2024-08-28 SURGERY — ABLATION, ENDOMETRIUM, HYSTEROSCOPIC
Anesthesia: General

## 2024-08-28 MED ORDER — LACTATED RINGERS IV SOLN: INTRAVENOUS | Status: DC

## 2024-08-28 MED ORDER — ORAL CARE MOUTH RINSE: 15.0000 mL | Freq: Once | OROMUCOSAL | Status: AC

## 2024-08-28 MED ORDER — LIDOCAINE 2% (20 MG/ML) 5 ML SYRINGE
INTRAMUSCULAR | Status: AC
  Filled 2024-08-28: qty 5

## 2024-08-28 MED ORDER — KETOROLAC TROMETHAMINE 30 MG/ML IJ SOLN
INTRAMUSCULAR | Status: DC | PRN
  Administered 2024-08-28: 30 mg via INTRAVENOUS

## 2024-08-28 MED ORDER — ONDANSETRON HCL 4 MG/2ML IJ SOLN
INTRAMUSCULAR | Status: DC | PRN
  Administered 2024-08-28: 4 mg via INTRAVENOUS

## 2024-08-28 MED ORDER — ACETAMINOPHEN 500 MG PO TABS
ORAL_TABLET | ORAL | Status: AC
  Filled 2024-08-28: qty 2

## 2024-08-28 MED ORDER — DEXAMETHASONE SOD PHOSPHATE PF 10 MG/ML IJ SOLN
INTRAMUSCULAR | Status: DC | PRN
  Administered 2024-08-28: 10 mg via INTRAVENOUS

## 2024-08-28 MED ORDER — NORETHINDRONE ACETATE 5 MG PO TABS: 5.0000 mg | ORAL_TABLET | Freq: Every day | ORAL | 0 refills | Status: DC

## 2024-08-28 MED ORDER — FENTANYL CITRATE (PF) 250 MCG/5ML IJ SOLN
INTRAMUSCULAR | Status: AC
  Filled 2024-08-28: qty 5

## 2024-08-28 MED ORDER — CHLORHEXIDINE GLUCONATE 0.12 % MT SOLN
15.0000 mL | Freq: Once | OROMUCOSAL | Status: AC
  Administered 2024-08-28: 15 mL via OROMUCOSAL

## 2024-08-28 MED ORDER — ACETAMINOPHEN 500 MG PO TABS
1000.0000 mg | ORAL_TABLET | ORAL | Status: AC
  Administered 2024-08-28: 1000 mg via ORAL

## 2024-08-28 MED ORDER — FENTANYL CITRATE (PF) 250 MCG/5ML IJ SOLN
INTRAMUSCULAR | Status: DC | PRN
  Administered 2024-08-28 (×2): 50 ug via INTRAVENOUS

## 2024-08-28 MED ORDER — LIDOCAINE HCL 1 % IJ SOLN
INTRAMUSCULAR | Status: DC | PRN
  Administered 2024-08-28: 20 mL

## 2024-08-28 MED ORDER — PROPOFOL 10 MG/ML IV BOLUS
INTRAVENOUS | Status: AC
Start: 2024-08-28 — End: 2024-08-28
  Filled 2024-08-28: qty 20

## 2024-08-28 MED ORDER — MIDAZOLAM HCL (PF) 2 MG/2ML IJ SOLN
INTRAMUSCULAR | Status: DC | PRN
  Administered 2024-08-28: 2 mg via INTRAVENOUS

## 2024-08-28 MED ORDER — LIDOCAINE HCL 1 % IJ SOLN
INTRAMUSCULAR | Status: AC
Start: 2024-08-28 — End: 2024-08-28
  Filled 2024-08-28: qty 20

## 2024-08-28 MED ORDER — CHLORHEXIDINE GLUCONATE 0.12 % MT SOLN
OROMUCOSAL | Status: AC
  Filled 2024-08-28: qty 15

## 2024-08-28 MED ORDER — PROPOFOL 10 MG/ML IV BOLUS
INTRAVENOUS | Status: DC | PRN
  Administered 2024-08-28: 50 mg via INTRAVENOUS
  Administered 2024-08-28: 150 mg via INTRAVENOUS

## 2024-08-28 MED ORDER — OXYCODONE HCL 5 MG PO TABS: 5.0000 mg | ORAL_TABLET | Freq: Four times a day (QID) | ORAL | 0 refills | Status: DC | PRN

## 2024-08-28 MED ORDER — LIDOCAINE 2% (20 MG/ML) 5 ML SYRINGE
INTRAMUSCULAR | Status: DC | PRN
  Administered 2024-08-28: 60 mg via INTRAVENOUS

## 2024-08-28 MED ORDER — ONDANSETRON HCL 4 MG/2ML IJ SOLN
INTRAMUSCULAR | Status: AC
  Filled 2024-08-28: qty 2

## 2024-08-28 MED ORDER — POVIDONE-IODINE 10 % EX SWAB: 2.0000 | Freq: Once | CUTANEOUS | Status: DC

## 2024-08-28 MED ORDER — FENTANYL CITRATE (PF) 100 MCG/2ML IJ SOLN: 25.0000 ug | INTRAMUSCULAR | Status: DC | PRN

## 2024-08-28 MED ORDER — MIDAZOLAM HCL 2 MG/2ML IJ SOLN
INTRAMUSCULAR | Status: AC
Start: 2024-08-28 — End: 2024-08-28
  Filled 2024-08-28: qty 2

## 2024-08-28 SURGICAL SUPPLY — 11 items
ABLATOR SURESOUND NOVASURE (ABLATOR) IMPLANT
GLOVE ECLIPSE 7.0 STRL STRAW (GLOVE) ×2 IMPLANT
GLOVE SURG UNDER POLY LF SZ7 (GLOVE) ×4 IMPLANT
GOWN STRL REUS W/ TWL XL LVL3 (GOWN DISPOSABLE) ×2 IMPLANT
PACK VAGINAL MINOR WOMEN LF (CUSTOM PROCEDURE TRAY) ×2 IMPLANT
PAD OB MATERNITY 11 LF (PERSONAL CARE ITEMS) ×2 IMPLANT
SEAL ROD LENS SCOPE MYOSURE (ABLATOR) IMPLANT
SET GENESYS HTA PROCERVA (MISCELLANEOUS) IMPLANT
SUT VIC AB 3-0 SH 27X BRD (SUTURE) IMPLANT
TOWEL GREEN STERILE FF (TOWEL DISPOSABLE) ×2 IMPLANT
UNDERPAD 30X36 HEAVY ABSORB (UNDERPADS AND DIAPERS) ×2 IMPLANT

## 2024-08-28 NOTE — Op Note (Signed)
 Preoperative diagnosis: Menorrhagia  Postoperative diagnosis: Menorrhagia  Procedure: HTA endometrial ablation, hysteroscopy, D & C  Surgeon: Glenys RAMAN. Fredirick, M.D.  Anesthesia: ARTHURO GLENWOOD Epifanio Elsie, MD Paracervical block  Findings: Normal appearing cervix and uterine cavity with both ostia seen.  Estimated blood loss: Minimal  Specimens: Endometrial curettings  Disposition of specimen: Pathology  Reason for procedure: Patient is a 38 y.o. [id number] with abnormal bleeding who desired definitive treatment.   Procedure: Patient was taken to the OR where she was placed in dorsal lithotomy in Allen stirrups. SCDs were in place. An adequate timeout was obtained. The patient was prepped and draped in the usual sterile fashion. A red rubber catheter is used to drain her bladder. A speculum was placed inside the vagina and the cervix visualized. The cervix was grasped anteriorly with a single-tooth tenaculum. 20 cc of quarter percent Marcaine were injected for paracervical block. The uterus sounded to 10 cm. Sequential dilation was done to a #8 dilator, and the HTA with hysteroscope was introduced into the uterine cavity. The cervix and endometrial lining appeared normal both ostia were seen there was no deformity of the cavity. A seal test was done and was passed. The uterine cavity was heated to between 80 and 90C and HTA was performed for 10 minutes. Cooling was then performed and the instrument removed. Sharp curettage was then performed and sample sent to pathology. All instrument, needle, and lap counts were correct x2. The patient was awakened taken to recovery room in stable condition.  Glenys RAMAN Fredirick MD 08/28/2024 3:43 PM

## 2024-08-28 NOTE — Anesthesia Postprocedure Evaluation (Signed)
 Anesthesia Post Note  Patient: Tiffany Welch  Procedure(s) Performed: ABLATION, ENDOMETRIUM, HYSTEROSCOPIC DILATATION AND CURETTAGE /HYSTEROSCOPY     Patient location during evaluation: PACU Anesthesia Type: General Level of consciousness: awake and alert Pain management: pain level controlled Vital Signs Assessment: post-procedure vital signs reviewed and stable Respiratory status: spontaneous breathing, nonlabored ventilation and respiratory function stable Cardiovascular status: blood pressure returned to baseline and stable Postop Assessment: no apparent nausea or vomiting Anesthetic complications: no   No notable events documented.  Last Vitals:  Vitals:   08/28/24 1645 08/28/24 1659  BP: 111/67 103/66  Pulse: 68 77  Resp: 15 17  Temp:    SpO2: 98% 98%    Last Pain:  Vitals:   08/28/24 1659  TempSrc:   PainSc: 2                  Febe Champa,W. EDMOND

## 2024-08-28 NOTE — H&P (Signed)
 Tiffany Welch is an 38 y.o. [phone] female.   Chief Complaint: abnormal uterine bleeding HPI: H/o BTL prior with abnormal bleeding. On Aygestin . Has failed an IUD in the past.  Past Medical History:  Diagnosis Date   Anxiety    Bacterial URI 07/15/2022   Exposure to the flu 10/06/2022   Non-recurrent acute suppurative otitis media of left ear without spontaneous rupture of tympanic membrane 10/06/2022   Seasonal allergic rhinitis due to pollen 06/22/2022   Wellness examination 06/01/2022    Past Surgical History:  Procedure Laterality Date   CHOLECYSTECTOMY     TUBAL LIGATION      Family History  Problem Relation Age of Onset   Breast cancer Mother 65   Breast cancer Sister 8   Social History:  reports that she has never smoked. She has been exposed to tobacco smoke. She has never used smokeless tobacco. She reports that she does not currently use alcohol. She reports that she does not use drugs.  Allergies:  Allergies  Allergen Reactions   Codeine Hives   Doxycycline Swelling    No medications prior to admission.    A comprehensive review of systems was negative.  Weight 88.9 kg. Wt 88.9 kg   BMI 32.62 kg/m  General appearance: alert, cooperative, and appears stated age Head: Normocephalic, without obvious abnormality, atraumatic Neck: supple, symmetrical, trachea midline Lungs: normal effort Abdomen: soft, non-tender; bowel sounds normal; no masses,  no organomegaly Extremities: extremities normal, atraumatic, no cyanosis or edema Skin: Skin color, texture, turgor normal. No rashes or lesions   No results found for this or any previous visit (from the past 24 hours). No results found.  Assessment/Plan Active Problems:   Abnormal uterine bleeding  For D & C with hysteroscopy and HTA Risks include but are not limited to bleeding, infection, injury to surrounding structures, including bowel, bladder and ureters, blood clots, and death.  Likelihood of success  is high.    Tiffany Welch 08/28/2024, 12:29 PM

## 2024-08-28 NOTE — Transfer of Care (Signed)
 Immediate Anesthesia Transfer of Care Note  Patient: Tiffany Welch  Procedure(s) Performed: ABLATION, ENDOMETRIUM, HYSTEROSCOPIC DILATATION AND CURETTAGE /HYSTEROSCOPY  Patient Location: PACU  Anesthesia Type:General  Level of Consciousness: drowsy  Airway & Oxygen Therapy: Patient connected to face mask oxygen  Post-op Assessment: Report given to RN, vitals stable  Post vital signs: Reviewed and stable  Last Vitals:  Vitals Value Taken Time  BP 116/84 08/28/24 15:55  Temp    Pulse 54 08/28/24 15:57  Resp 12 08/28/24 15:57  SpO2 100 % 08/28/24 15:57  Vitals shown include unfiled device data.  Last Pain:  Vitals:   08/28/24 1338  TempSrc: Oral  PainSc: 0-No pain      Patients Stated Pain Goal: 4 (08/28/24 1338)  Complications: No notable events documented.

## 2024-08-28 NOTE — Anesthesia Preprocedure Evaluation (Addendum)
 Anesthesia Evaluation  Patient identified by MRN, date of birth, ID band Patient awake    Reviewed: Allergy  & Precautions, H&P , NPO status , Patient's Chart, lab work & pertinent test results  Airway Mallampati: II  TM Distance: >3 FB Neck ROM: Full    Dental no notable dental hx. (+) Teeth Intact, Dental Advisory Given   Pulmonary neg pulmonary ROS   Pulmonary exam normal breath sounds clear to auscultation       Cardiovascular negative cardio ROS  Rhythm:Regular Rate:Normal     Neuro/Psych  Headaches  Anxiety        GI/Hepatic negative GI ROS, Neg liver ROS,,,  Endo/Other  negative endocrine ROS    Renal/GU negative Renal ROS  negative genitourinary   Musculoskeletal   Abdominal   Peds  Hematology negative hematology ROS (+)   Anesthesia Other Findings   Reproductive/Obstetrics negative OB ROS                              Anesthesia Physical Anesthesia Plan  ASA: 2  Anesthesia Plan: General   Post-op Pain Management: Tylenol PO (pre-op)* and Toradol  IV (intra-op)*   Induction: Intravenous  PONV Risk Score and Plan: 4 or greater and Ondansetron , Dexamethasone  and Midazolam  Airway Management Planned: LMA  Additional Equipment:   Intra-op Plan:   Post-operative Plan: Extubation in OR  Informed Consent: I have reviewed the patients History and Physical, chart, labs and discussed the procedure including the risks, benefits and alternatives for the proposed anesthesia with the patient or authorized representative who has indicated his/her understanding and acceptance.     Dental advisory given  Plan Discussed with: CRNA  Anesthesia Plan Comments:          Anesthesia Quick Evaluation

## 2024-08-28 NOTE — Anesthesia Procedure Notes (Signed)
 Procedure Name: LMA Insertion Date/Time: 08/28/2024 3:08 PM  Performed by: Julien Manus, CRNAPre-anesthesia Checklist: Patient identified, Emergency Drugs available, Suction available and Patient being monitored Patient Re-evaluated:Patient Re-evaluated prior to induction Oxygen Delivery Method: Circle System Utilized Preoxygenation: Pre-oxygenation with 100% oxygen Induction Type: IV induction Ventilation: Mask ventilation without difficulty LMA: LMA inserted LMA Size: 4.0 Number of attempts: 1 Airway Equipment and Method: Bite block Placement Confirmation: positive ETCO2 Tube secured with: Tape Dental Injury: Teeth and Oropharynx as per pre-operative assessment

## 2024-08-29 ENCOUNTER — Encounter (HOSPITAL_COMMUNITY): Payer: Self-pay | Admitting: Family Medicine

## 2024-08-30 LAB — SURGICAL PATHOLOGY

## 2024-08-31 ENCOUNTER — Ambulatory Visit: Payer: Self-pay | Admitting: Family Medicine

## 2024-09-06 ENCOUNTER — Emergency Department (HOSPITAL_BASED_OUTPATIENT_CLINIC_OR_DEPARTMENT_OTHER)
Admission: EM | Admit: 2024-09-06 | Discharge: 2024-09-06 | Disposition: A | Discharge status: Home / Self Care | Attending: Emergency Medicine | Admitting: Emergency Medicine

## 2024-09-06 ENCOUNTER — Encounter (HOSPITAL_BASED_OUTPATIENT_CLINIC_OR_DEPARTMENT_OTHER): Payer: Self-pay | Admitting: Emergency Medicine

## 2024-09-06 ENCOUNTER — Other Ambulatory Visit: Payer: Self-pay

## 2024-09-06 DIAGNOSIS — Z4801 Encounter for change or removal of surgical wound dressing: Secondary | ICD-10-CM | POA: Diagnosis not present

## 2024-09-06 DIAGNOSIS — Z5189 Encounter for other specified aftercare: Secondary | ICD-10-CM

## 2024-09-06 DIAGNOSIS — L988 Other specified disorders of the skin and subcutaneous tissue: Secondary | ICD-10-CM | POA: Insufficient documentation

## 2024-09-06 MED ORDER — MUPIROCIN 2 % EX OINT: TOPICAL_OINTMENT | Freq: Two times a day (BID) | CUTANEOUS | 0 refills | Status: DC

## 2024-09-06 NOTE — ED Notes (Signed)
 In room as chaperone for visualization of the pts place of complaint.  Same was visualized as 2 places of minor skin beak down in stages of healing.  Pt is in no discomfort. Same are less then a dime in size.

## 2024-09-06 NOTE — ED Provider Notes (Signed)
 [place] [facility] Provider Note   CSN: [id number] Arrival date & time: 09/06/24  0031     Patient presents with: Wound Check   Tiffany Welch is a 38 y.o. female.   The history is provided by the patient.  Wound Check This is a new problem. The problem occurs constantly. Progression since onset: unknown. Nothing aggravates the symptoms. Nothing relieves the symptoms. She has tried nothing for the symptoms. The treatment provided no relief.  Patient had D/C on 11/11 and tonight noted 2 circular lesions between buttock cheeks.       Prior to Admission medications   Medication Sig Start Date End Date Taking? Authorizing Provider  mupirocin ointment (BACTROBAN) 2 % Apply topically 2 (two) times daily. 09/06/24  Yes Abdiaziz Klahn, MD  ALPRAZolam  (XANAX ) 0.25 MG tablet Take 1/2 to 1 tab by mouth up to twice a day as needed for panic symptoms. 11/22/23   Early, Sara E, NP  norethindrone  (AYGESTIN ) 5 MG tablet Take 1 tablet (5 mg total) by mouth daily for 14 days. 08/28/24 09/11/24  Fredirick Glenys RAMAN, MD  oxyCODONE (OXY IR/ROXICODONE) 5 MG immediate release tablet Take 1 tablet (5 mg total) by mouth every 6 (six) hours as needed for severe pain (pain score 7-10). 08/28/24   Fredirick Glenys RAMAN, MD  sertraline  (ZOLOFT ) 25 MG tablet Take 1 tablet (25 mg total) by mouth daily. Patient not taking: No sig reported 11/22/23   Early, Sara E, NP    Allergies: Codeine and Doxycycline    Review of Systems  Constitutional:  Negative for fever.  Respiratory:  Negative for wheezing and stridor.   Gastrointestinal:  Negative for vomiting.  Skin:  Positive for wound.  All other systems reviewed and are negative.   Updated Vital Signs BP 134/69 (BP Location: Right Arm)   Pulse 78   Temp 98.1 F (36.7 C) (Oral)   Resp 16   LMP 07/28/2024 (Approximate)   SpO2 100%   Physical Exam Vitals and nursing note reviewed. Exam conducted with a chaperone present.   Constitutional:      General: She is not in acute distress.    Appearance: She is well-developed.  HENT:     Head: Normocephalic and atraumatic.     Nose: Nose normal.  Eyes:     Pupils: Pupils are equal, round, and reactive to light.  Cardiovascular:     Rate and Rhythm: Normal rate and regular rhythm.     Pulses: Normal pulses.     Heart sounds: Normal heart sounds.  Pulmonary:     Effort: Pulmonary effort is normal. No respiratory distress.     Breath sounds: Normal breath sounds.  Abdominal:     General: Bowel sounds are normal. There is no distension.     Palpations: Abdomen is soft.     Tenderness: There is no abdominal tenderness. There is no guarding or rebound.  Genitourinary:  Musculoskeletal:        General: Normal range of motion.     Cervical back: Neck supple.  Skin:    General: Skin is dry.     Capillary Refill: Capillary refill takes less than 2 seconds.     Findings: No erythema or rash.  Neurological:     General: No focal deficit present.     Deep Tendon Reflexes: Reflexes normal.  Psychiatric:        Mood and Affect: Mood normal.     (all labs ordered are listed,  but only abnormal results are displayed) Labs Reviewed - No data to display  EKG: None  Radiology: No results found.   Procedures   Medications Ordered in the ED - No data to display                                  Medical Decision Making Buttock lesions  Amount and/or Complexity of Data Reviewed External Data Reviewed: notes.    Details: Previous notes reviewed   Risk Prescription drug management. Risk Details: Lesions likely from chaffing as symmetrically touching but are clearly healing by secondary intention and no signs of infection or abscess.  Bactroban  to prevent infection. Wound care instructions given.  Stable for discharge.       Final diagnoses:  Visit for wound check  No signs of systemic illness or infection. The patient is nontoxic-appearing on exam  and vital signs are within normal limits.  I have reviewed the triage vital signs and the nursing notes. Pertinent labs & imaging results that were available during my care of the patient were reviewed by me and considered in my medical decision making (see chart for details). After history, exam, and medical workup I feel the patient has been appropriately medically screened and is safe for discharge home. Pertinent diagnoses were discussed with the patient. Patient was given return precautions.   ED Discharge Orders          Ordered    mupirocin  ointment (BACTROBAN ) 2 %  2 times daily        09/06/24 0155               Asahd Can, MD 09/06/24 [phone]

## 2024-09-06 NOTE — ED Triage Notes (Signed)
 Pt reports D & C on 11/11.  Tonight while showering noted abrasions/raw places to bilateral buttocks.  Wonders if this is an effect from the pads she has been wearing.

## 2024-09-18 ENCOUNTER — Encounter (HOSPITAL_BASED_OUTPATIENT_CLINIC_OR_DEPARTMENT_OTHER): Payer: Self-pay

## 2024-09-18 ENCOUNTER — Emergency Department (HOSPITAL_BASED_OUTPATIENT_CLINIC_OR_DEPARTMENT_OTHER)
Admission: EM | Admit: 2024-09-18 | Discharge: 2024-09-18 | Disposition: A | Discharge status: Home / Self Care | Attending: Emergency Medicine | Admitting: Emergency Medicine

## 2024-09-18 DIAGNOSIS — B9789 Other viral agents as the cause of diseases classified elsewhere: Secondary | ICD-10-CM | POA: Diagnosis not present

## 2024-09-18 DIAGNOSIS — J069 Acute upper respiratory infection, unspecified: Secondary | ICD-10-CM | POA: Diagnosis not present

## 2024-09-18 DIAGNOSIS — R059 Cough, unspecified: Secondary | ICD-10-CM | POA: Diagnosis present

## 2024-09-18 LAB — RESP PANEL BY RT-PCR (RSV, FLU A&B, COVID)  RVPGX2
Influenza A by PCR: NEGATIVE
Influenza B by PCR: NEGATIVE
Resp Syncytial Virus by PCR: NEGATIVE
SARS Coronavirus 2 by RT PCR: NEGATIVE

## 2024-09-18 LAB — GROUP A STREP BY PCR: Group A Strep by PCR: NOT DETECTED

## 2024-09-18 NOTE — ED Provider Notes (Signed)
 [place] [facility]  Provider Note  CSN: [id number] Arrival date & time: 09/18/24 2238  History Chief Complaint  Patient presents with   Cough    Colene Mines is a 38 y.o. female with no significant PMH reports 3-4 days of cough, sore throat, nasal congestion. No fevers or vomiting. She reports some redness in her R eye today without itching or drainage. No crusting.    Home Medications Prior to Admission medications   Medication Sig Start Date End Date Taking? Authorizing Provider  ALPRAZolam  (XANAX ) 0.25 MG tablet Take 1/2 to 1 tab by mouth up to twice a day as needed for panic symptoms. 11/22/23   Early, Sara E, NP  mupirocin  ointment (BACTROBAN ) 2 % Apply topically 2 (two) times daily. 09/06/24   Palumbo, April, MD  norethindrone  (AYGESTIN ) 5 MG tablet Take 1 tablet (5 mg total) by mouth daily for 14 days. 08/28/24 09/11/24  Fredirick Glenys RAMAN, MD  oxyCODONE  (OXY IR/ROXICODONE ) 5 MG immediate release tablet Take 1 tablet (5 mg total) by mouth every 6 (six) hours as needed for severe pain (pain score 7-10). 08/28/24   Fredirick Glenys RAMAN, MD  sertraline  (ZOLOFT ) 25 MG tablet Take 1 tablet (25 mg total) by mouth daily. Patient not taking: No sig reported 11/22/23   Early, Camie BRAVO, NP     Allergies    Codeine and Doxycycline   Review of Systems   Review of Systems Please see HPI for pertinent positives and negatives  Physical Exam BP 131/86 (BP Location: Right Wrist)   Pulse 83   Temp 98.2 F (36.8 C) (Oral)   Resp 16   Ht 5' 5 (1.651 m)   Wt 86.2 kg   LMP 07/28/2024 (Approximate)   SpO2 98%   BMI 31.62 kg/m   Physical Exam Vitals and nursing note reviewed.  Constitutional:      Appearance: Normal appearance.  HENT:     Head: Normocephalic and atraumatic.     Nose: Nose normal.     Mouth/Throat:     Mouth: Mucous membranes are moist.     Pharynx: No pharyngeal swelling, oropharyngeal exudate or posterior oropharyngeal erythema.   Eyes:     Extraocular Movements: Extraocular movements intact.     Comments: Mild R conjunctival injection without discharge  Cardiovascular:     Rate and Rhythm: Normal rate.  Pulmonary:     Effort: Pulmonary effort is normal.     Breath sounds: Normal breath sounds.  Abdominal:     General: Abdomen is flat.     Palpations: Abdomen is soft.     Tenderness: There is no abdominal tenderness.  Musculoskeletal:        General: No swelling. Normal range of motion.     Cervical back: Neck supple.  Skin:    General: Skin is warm and dry.  Neurological:     General: No focal deficit present.     Mental Status: She is alert.  Psychiatric:        Mood and Affect: Mood normal.     ED Results / Procedures / Treatments   EKG None  Procedures Procedures  Medications Ordered in the ED Medications - No data to display  Initial Impression and Plan  Patient here with likely uncomplicated viral URI, exam and vitals reassuring. Swabs collected in triage are pending.   ED Course   Clinical Course as of 09/18/24 2345  Tue Sep 18, 2024  2339 Covid/Flu/RSV and strep  swabs are negative. Continue with supportive and symptomatic care at home. PCP follow up, RTED for any other concerns.   [CS]    Clinical Course User Index [CS] Roselyn Carlin NOVAK, MD     MDM Rules/Calculators/A&P Medical Decision Making Problems Addressed: Viral URI with cough: acute illness or injury  Amount and/or Complexity of Data Reviewed Labs: ordered. Decision-making details documented in ED Course.  Risk OTC drugs.     Final Clinical Impression(s) / ED Diagnoses Final diagnoses:  Viral URI with cough    Rx / DC Orders ED Discharge Orders     None        Roselyn Carlin NOVAK, MD 09/18/24 2345

## 2024-09-18 NOTE — ED Triage Notes (Signed)
 Patient reports productive cough, sore throat, since 11/28.  Patient awoke this morning to her right eye being red.

## 2024-09-19 ENCOUNTER — Encounter: Payer: Self-pay | Admitting: Family Medicine

## 2024-09-19 ENCOUNTER — Ambulatory Visit: Admitting: Family Medicine

## 2024-09-19 VITALS — BP 113/73 | HR 76 | Wt 202.4 lb

## 2024-09-19 DIAGNOSIS — N939 Abnormal uterine and vaginal bleeding, unspecified: Secondary | ICD-10-CM

## 2024-09-19 NOTE — Progress Notes (Signed)
   Subjective:    Patient ID: Tiffany Welch is a 38 y.o. female presenting with No chief complaint on file.  on 09/19/2024  HPI: Doing well postoperatively. Only needed Ibuprofen . Noted postsurgically some skin changes. ? Boils.   Review of Systems  Constitutional:  Negative for chills and fever.  Respiratory:  Negative for shortness of breath.   Cardiovascular:  Negative for chest pain.  Gastrointestinal:  Negative for abdominal pain, nausea and vomiting.  Genitourinary:  Negative for dysuria.  Skin:  Negative for rash.      Objective:    BP 113/73   Pulse 76   Wt 202 lb 6.4 oz (91.8 kg)   LMP 07/28/2024 (Approximate)   BMI 33.68 kg/m  Physical Exam Exam conducted with a chaperone present.  Constitutional:      General: She is not in acute distress.    Appearance: She is well-developed.  HENT:     Head: Normocephalic and atraumatic.  Eyes:     General: No scleral icterus. Cardiovascular:     Rate and Rhythm: Normal rate.  Pulmonary:     Effort: Pulmonary effort is normal.  Abdominal:     Palpations: Abdomen is soft.  Genitourinary:  Musculoskeletal:     Cervical back: Neck supple.  Skin:    General: Skin is warm and dry.  Neurological:     Mental Status: She is alert and oriented to person, place, and time.         Assessment & Plan:  Abnormal uterine bleeding - s/p HTA, healing well. See how cycel is going forward.   Return if symptoms worsen or fail to improve.  Glenys GORMAN Birk, MD 09/19/2024 1:51 PM

## 2024-09-25 ENCOUNTER — Telehealth: Payer: Self-pay

## 2024-09-25 NOTE — Telephone Encounter (Signed)
 I left the patient a message stating that Dr. Fredirick is available on 12//22/25. Patient called back and stated she had surgery w/ Dr. Fredirick on 08/28/24 and she's recovering well.

## 2024-10-09 ENCOUNTER — Encounter: Payer: Self-pay | Admitting: Family Medicine

## 2024-10-09 ENCOUNTER — Ambulatory Visit: Admitting: Family Medicine

## 2024-10-09 VITALS — BP 110/70 | HR 55 | Wt 204.1 lb

## 2024-10-09 DIAGNOSIS — N939 Abnormal uterine and vaginal bleeding, unspecified: Secondary | ICD-10-CM

## 2024-10-09 MED ORDER — NORETHINDRONE ACETATE 5 MG PO TABS: 10.0000 mg | ORAL_TABLET | Freq: Every day | ORAL | 3 refills | Status: DC

## 2024-10-09 NOTE — Progress Notes (Signed)
" ° °  Subjective:    Patient ID: Tiffany Welch is a 38 y.o. female presenting with No chief complaint on file.  on 10/09/2024  HPI: S/p HTA in 08/28/24. Heavy vaginal bleeding since Saturday. Feeling sluggish and tired. Had cold feelings and headache.  Review of Systems  Constitutional:  Negative for chills and fever.  Respiratory:  Negative for shortness of breath.   Cardiovascular:  Negative for chest pain.  Gastrointestinal:  Negative for abdominal pain, nausea and vomiting.  Genitourinary:  Positive for vaginal bleeding. Negative for dysuria.  Skin:  Negative for rash.      Objective:    BP 110/70   Pulse (!) 55   Wt 204 lb 2 oz (92.6 kg)   BMI 33.97 kg/m  Physical Exam Exam conducted with a chaperone present.  Constitutional:      General: She is not in acute distress.    Appearance: She is well-developed.  HENT:     Head: Normocephalic and atraumatic.  Eyes:     General: No scleral icterus. Cardiovascular:     Rate and Rhythm: Normal rate.  Pulmonary:     Effort: Pulmonary effort is normal.  Abdominal:     Palpations: Abdomen is soft.  Musculoskeletal:     Cervical back: Neck supple.  Skin:    General: Skin is warm and dry.  Neurological:     Mental Status: She is alert and oriented to person, place, and time.         Assessment & Plan:   Problem List Items Addressed This Visit       Unprioritized   Abnormal uterine bleeding - Primary   Check labs to ensure she is not too anemic. Also, if her thyroid  is not well controlled, her bleeding will be worse. Discussed all options with risks including hysterectomy. Will start with Aygestin  and she will let me know if she wants to proceed with further surgery.      Relevant Medications   norethindrone  (AYGESTIN ) 5 MG tablet   Other Relevant Orders   TSH (Completed)   CBC (Completed)    No follow-ups on file.  Glenys GORMAN Birk, MD 10/09/2024 3:20 PM   "

## 2024-10-10 LAB — TSH: TSH: 0.515 u[IU]/mL (ref 0.450–4.500)

## 2024-10-10 LAB — CBC
Hematocrit: 37.3 % (ref 34.0–46.6)
Hemoglobin: 12.4 g/dL (ref 11.1–15.9)
MCH: 31.5 pg (ref 26.6–33.0)
MCHC: 33.2 g/dL (ref 31.5–35.7)
MCV: 95 fL (ref 79–97)
Platelets: 271 x10E3/uL (ref 150–450)
RBC: 3.94 x10E6/uL (ref 3.77–5.28)
RDW: 12.1 % (ref 11.7–15.4)
WBC: 4.7 x10E3/uL (ref 3.4–10.8)

## 2024-10-14 NOTE — Assessment & Plan Note (Signed)
 Check labs to ensure she is not too anemic. Also, if her thyroid  is not well controlled, her bleeding will be worse. Discussed all options with risks including hysterectomy. Will start with Aygestin  and she will let me know if she wants to proceed with further surgery.

## 2024-10-15 ENCOUNTER — Ambulatory Visit: Payer: Self-pay | Admitting: Family Medicine

## 2024-11-21 NOTE — Progress Notes (Unsigned)
 {SEHM (Optional):34217}  Catheline Doing, DNP, AGNP-c [facility] [address] [place], [place] [address] Main Office [phone] VISIT TYPE: CPE on 11/22/2024 There were no vitals filed for this visit. There is no height or weight on file to calculate BMI.  Wt Readings from Last 3 Encounters:  10/09/24 204 lb 2 oz (92.6 kg)  09/19/24 202 lb 6.4 oz (91.8 kg)  09/18/24 190 lb (86.2 kg)     Subjective:  No chief complaint on file.   *** {Tiffany Welch; complete female:[id number]::Pertinent items are noted in HPI.}     11/22/2023    2:59 PM 10/26/2023   11:40 AM 07/15/2022   10:20 AM 07/02/2022   10:55 AM 06/01/2022    4:45 PM  Depression screen PHQ 2/9  Decreased Interest 0 0 0 0 1  Down, Depressed, Hopeless 0 0 1 0 1  PHQ - 2 Score 0 0 1 0 2  Altered sleeping  1 0 0 1  Tired, decreased energy  1 0 0 1  Change in appetite  3 1 0 1  Feeling bad or failure about yourself   0 0 0 0  Trouble concentrating  1 0 0 0  Moving slowly or fidgety/restless  0 0 0 0  Suicidal thoughts  0 0 0 0  PHQ-9 Score  6  2  0  5   Difficult doing work/chores  Somewhat difficult Not difficult at all Not difficult at all Not difficult at all     Data saved with a previous flowsheet row definition       11/22/2023    3:00 PM 10/26/2023   11:43 AM 07/15/2022   10:21 AM  GAD 7 : Generalized Anxiety Score  Nervous, Anxious, on Edge 1  3  0   Control/stop worrying 0  3  0   Worry too much - different things 1  0  3   Trouble relaxing 1  3  0   Restless 0  1  0   Easily annoyed or irritable 1  0    Afraid - awful might happen 1  3  1    Total GAD 7 Score 5 13   Anxiety Difficulty Very difficult Somewhat difficult Somewhat difficult     Data saved with a previous flowsheet row definition       11/22/2023    2:59 PM 07/15/2022   10:20 AM 07/02/2022   10:55 AM 06/01/2022    4:45 PM  Fall Risk   Falls in the past year? 0 0 0 0  Number falls in past yr: 0 0 0 0  Injury with Fall? 0  0  0  0    Risk for fall due to : No Fall Risks No Fall Risks No Fall Risks No Fall Risks  Follow up Falls evaluation completed Falls evaluation completed;Education provided  Falls evaluation completed  Falls evaluation completed;Education provided      Data saved with a previous flowsheet row definition   Past medical history, surgical history, medications, allergies, family history and social history reviewed with patient today and changes made to appropriate areas of the chart.      Objective:    Physical Exam      Assessment & Plan:   Assessment & Plan  NEXT PREVENTATIVE PHYSICAL DUE IN 1 YEAR.    PATIENT COUNSELING PROVIDED FOR ALL ADULT PATIENTS: A well balanced diet low in saturated fats, cholesterol, and moderation in carbohydrates.  This can be as simple as  monitoring portion sizes and cutting back on sugary beverages such as soda and juice to start with.    Daily water consumption of at least 64 ounces.  Physical activity at least 180 minutes per week.  If just starting out, start 10 minutes a day and work your way up.   This can be as simple as taking the stairs instead of the elevator and walking 2-3 laps around the office  purposefully every day.   STD protection, partner selection, and regular testing if high risk.  Limited consumption of alcoholic beverages if alcohol is consumed. For men, I recommend no more than 14 alcoholic beverages per week, spread out throughout the week (max 2 per day). Avoid binge drinking or consuming large quantities of alcohol in one setting.  Please let me know if you feel you may need help with reduction or quitting alcohol consumption.   Avoidance of nicotine, if used. Please let me know if you feel you may need help with reduction or quitting nicotine use.   Daily mental health attention. This can be in the form of 5 minute daily meditation, prayer, journaling, yoga, reflection, etc.  Purposeful attention to your emotions and mental  state can significantly improve your overall wellbeing  and Health.  Please know that I am here to help you with all of your health care goals and am happy to work with you to find a solution that works best for you.  The greatest advice I have received with any changes in life are to take it one step at a time, that even means if all you can focus on is the next 60 seconds, then do that and celebrate your victories.  With any changes in life, you will have set backs, and that is OK. The important thing to remember is, if you have a set back, it is not a failure, it is an opportunity to try again! Screening Testing Mammogram Every 1 -2 years based on history and risk factors Starting at age 25 Pap Smear Ages 21-39 every 3 years Ages 40-65 every 5 years with HPV testing More frequent testing may be required based on results and history Colon Cancer Screening Every 1-10 years based on test performed, risk factors, and history Starting at age 56 Bone Density Screening Every 2-10 years based on history Starting at age 31 for women Recommendations for men differ based on medication usage, history, and risk factors AAA Screening One time ultrasound Men 29-84 years old who have every smoked Lung Cancer Screening Low Dose Lung CT every 12 months Age 68-80 years with a 30 pack-year smoking history who still smoke or who have quit within the last 15 years   Screening Labs Routine  Labs: Complete Blood Count (CBC), Complete Metabolic Panel (CMP), Cholesterol (Lipid Panel) Every 6-12 months based on history and medications May be recommended more frequently based on current conditions or previous results Hemoglobin A1c Lab Every 3-12 months based on history and previous results Starting at age 54 or earlier with diagnosis of diabetes, high cholesterol, BMI >26, and/or risk factors Frequent monitoring for patients with diabetes to ensure blood sugar control Thyroid  Panel (TSH) Every 6 months  based on history, symptoms, and risk factors May be repeated more often if on medication HIV One time testing for all patients 79 and older May be repeated more frequently for patients with increased risk factors or exposure Hepatitis C One time testing for all patients 39 and older May be repeated more  frequently for patients with increased risk factors or exposure Gonorrhea, Chlamydia Every 12 months for all sexually active persons 13-24 years Additional monitoring may be recommended for those who are considered high risk or who have symptoms Every 12 months for any woman on birth control, regardless of sexual activity PSA Men 21-34 years old with risk factors Additional screening may be recommended from age 44-69 based on risk factors, symptoms, and history  Vaccine Recommendations Tetanus Booster All adults every 10 years Flu Vaccine All patients 6 months and older every year COVID Vaccine All patients 12 years and older Initial dosing with booster May recommend additional booster based on age and health history HPV Vaccine 2 doses all patients age 23-26 Dosing may be considered for patients over 26 Shingles Vaccine (Shingrix) 2 doses all adults 55 years and older Pneumonia (Pneumovax 32) All adults 65 years and older May recommend earlier dosing based on health history One year apart from Prevnar 59 Pneumonia (Prevnar 47) All adults 65 years and older Dosed 1 year after Pneumovax 23 Pneumonia (Prevnar 20) One time alternative to the two dosing of 13 and 23 For all adults with initial dose of 23, 20 is recommended 1 year later For all adults with initial dose of 13, 23 is still recommended as second option 1 year later

## 2024-11-22 ENCOUNTER — Ambulatory Visit: Payer: 59 | Admitting: Nurse Practitioner

## 2024-11-22 ENCOUNTER — Encounter: Payer: Self-pay | Admitting: Nurse Practitioner

## 2024-11-22 VITALS — BP 122/84 | HR 95 | Ht 65.0 in | Wt 199.0 lb

## 2024-11-22 DIAGNOSIS — Z1321 Encounter for screening for nutritional disorder: Secondary | ICD-10-CM | POA: Diagnosis not present

## 2024-11-22 DIAGNOSIS — Z130 Encounter for screening for diseases of the blood and blood-forming organs and certain disorders involving the immune mechanism: Secondary | ICD-10-CM

## 2024-11-22 DIAGNOSIS — K30 Functional dyspepsia: Secondary | ICD-10-CM

## 2024-11-22 DIAGNOSIS — G43411 Hemiplegic migraine, intractable, with status migrainosus: Secondary | ICD-10-CM

## 2024-11-22 DIAGNOSIS — M62838 Other muscle spasm: Secondary | ICD-10-CM | POA: Diagnosis not present

## 2024-11-22 DIAGNOSIS — Z13228 Encounter for screening for other metabolic disorders: Secondary | ICD-10-CM

## 2024-11-22 DIAGNOSIS — Z0000 Encounter for general adult medical examination without abnormal findings: Secondary | ICD-10-CM

## 2024-11-22 DIAGNOSIS — F410 Panic disorder [episodic paroxysmal anxiety] without agoraphobia: Secondary | ICD-10-CM | POA: Diagnosis not present

## 2024-11-22 DIAGNOSIS — Z1329 Encounter for screening for other suspected endocrine disorder: Secondary | ICD-10-CM | POA: Diagnosis not present

## 2024-11-22 DIAGNOSIS — E010 Iodine-deficiency related diffuse (endemic) goiter: Secondary | ICD-10-CM

## 2024-11-22 LAB — LIPID PANEL

## 2024-11-22 MED ORDER — METHOCARBAMOL 500 MG PO TABS: 500.0000 mg | ORAL_TABLET | Freq: Three times a day (TID) | ORAL | 1 refills | Status: AC | PRN

## 2024-11-22 MED ORDER — PANTOPRAZOLE SODIUM 40 MG PO TBEC: 40.0000 mg | DELAYED_RELEASE_TABLET | Freq: Every day | ORAL | 3 refills | Status: AC

## 2024-11-22 MED ORDER — ALPRAZOLAM 0.25 MG PO TABS: ORAL_TABLET | ORAL | 2 refills | Status: AC

## 2024-11-22 MED ORDER — RIZATRIPTAN BENZOATE 10 MG PO TABS: 10.0000 mg | ORAL_TABLET | ORAL | 6 refills | Status: AC | PRN

## 2024-11-22 NOTE — Assessment & Plan Note (Signed)
 Intermittent anxiety attacks with muscle tension and chest heaviness on the left chest wall, shoulder, and axillary area, exacerbated by stress and grief. Previous use of Xanax  was effective. Counseling was not as effective as she had hoped. She does not wish to utilize daily medication, if possible. We discussed the option of counseling again, today, but she would like to try other measures. Strongly encourage daily mindfulness and deep breathing and relaxation exercises to help with stress and anxiety. Alprazolam  refill provided for emergency use. Other measures to help with stress and loss were discussed.  - Prescribed Xanax  for anxiety as needed. - Recommended progressive muscle relaxation exercises before bed. - Encouraged journaling and therapeutic activities for stress management. Orders:   TSH   T4, free   ALPRAZolam  (XANAX ) 0.25 MG tablet; Take 1/2 to 1 tab by mouth up to twice a day as needed for panic symptoms.

## 2024-11-22 NOTE — Assessment & Plan Note (Signed)
 CPE completed today. Review of HM activities and recommendations discussed and provided on AVS. Anticipatory guidance, diet, and exercise recommendations provided. Medications, allergies, and hx reviewed and updated as necessary. Orders placed as listed below.  Plan: - Labs ordered. Will make changes as necessary based on results.  - I will review these results and send recommendations via MyChart or a telephone call.  - F/U with CPE in 1 year or sooner for acute/chronic health needs as directed.    Orders:   CBC with Differential/Platelet   CMP14+EGFR   Hemoglobin A1c   Lipid panel   TSH   T4, free

## 2024-11-22 NOTE — Patient Instructions (Addendum)
 Progressive Muscle Relaxation on YouTube- look this up and give this a try every night at bedtime to see if this helps with the tension and stress you are holding in your shoulders and chest.   VISIT SUMMARY:  During your annual physical exam, we discussed several health concerns including anxiety, muscle tension, gastrointestinal symptoms, migraines, and heavy menstrual bleeding. We also reviewed your general health maintenance and found your blood pressure to be well-controlled.  YOUR PLAN:  -PANIC DISORDER: Panic disorder involves sudden episodes of intense fear or discomfort. We have prescribed Xanax  to use as needed for anxiety and recommended progressive muscle relaxation exercises before bed. Journaling and engaging in therapeutic activities may also help manage stress.  -MUSCLE SPASM OF NECK AND UPPER BODY: Muscle spasms are involuntary contractions of muscles, often caused by stress. We have prescribed methocarbamol  (Robaxin ) for muscle relaxation at bedtime and recommended progressive muscle relaxation exercises. Using a massage gun may also help relieve muscle tension.  -FUNCTIONAL DYSPEPSIA: Functional dyspepsia is indigestion with no clear cause, often related to stress. We have prescribed pantoprazole  to take once daily for 30 days to help manage your symptoms of bloating, gas, and indigestion.  -INTRACTABLE HEMIPLEGIC MIGRAINE WITH STATUS MIGRAINOSUS: This type of migraine is severe and can cause temporary paralysis on one side of the body. We have prescribed rizatriptan  (Maxalt ) to take at the first sign of a migraine and advised against using Excedrin as it may worsen anxiety. Resting in a quiet, dark room may help alleviate symptoms.  -HEAVY MENSTRUAL BLEEDING: Heavy menstrual bleeding is excessive blood loss during periods. We discussed the possibility of a partial hysterectomy as a definitive treatment option and suggested consulting with your OB/GYN. We may wait for one or two  more cycles to assess your bleeding pattern.  -GENERAL HEALTH MAINTENANCE: Your annual physical examination showed that your blood pressure is well-controlled at 122/84 mmHg. Continue with your current lifestyle and health practices.  INSTRUCTIONS:  Please follow up with your OB/GYN to discuss the possibility of a partial hysterectomy for heavy menstrual bleeding. Continue taking your prescribed medications as directed and practice the recommended relaxation and stress management techniques. If you experience any new or worsening symptoms, please schedule an appointment.

## 2024-11-22 NOTE — Assessment & Plan Note (Signed)
 Will monitor thyroid  levels. Previous levels have shown slightly low TSH with normal T4.  Orders:   TSH   T4, free

## 2024-11-23 LAB — CMP14+EGFR
ALT: 12 [IU]/L (ref 0–32)
AST: 13 [IU]/L (ref 0–40)
Albumin: 4.6 g/dL (ref 3.9–4.9)
Alkaline Phosphatase: 73 [IU]/L (ref 41–116)
BUN/Creatinine Ratio: 15 (ref 9–23)
BUN: 10 mg/dL (ref 6–20)
Bilirubin Total: 0.6 mg/dL (ref 0.0–1.2)
CO2: 23 mmol/L (ref 20–29)
Calcium: 9.7 mg/dL (ref 8.7–10.2)
Chloride: 102 mmol/L (ref 96–106)
Creatinine, Ser: 0.68 mg/dL (ref 0.57–1.00)
Globulin, Total: 2.9 g/dL (ref 1.5–4.5)
Glucose: 87 mg/dL (ref 70–99)
Potassium: 4.4 mmol/L (ref 3.5–5.2)
Sodium: 139 mmol/L (ref 134–144)
Total Protein: 7.5 g/dL (ref 6.0–8.5)
eGFR: 114 mL/min/{1.73_m2}

## 2024-11-23 LAB — CBC WITH DIFFERENTIAL/PLATELET
Basophils Absolute: 0 10*3/uL (ref 0.0–0.2)
Basos: 1 %
EOS (ABSOLUTE): 0.2 10*3/uL (ref 0.0–0.4)
Eos: 3 %
Hematocrit: 42.8 % (ref 34.0–46.6)
Hemoglobin: 13.9 g/dL (ref 11.1–15.9)
Immature Grans (Abs): 0 10*3/uL (ref 0.0–0.1)
Immature Granulocytes: 0 %
Lymphocytes Absolute: 2.4 10*3/uL (ref 0.7–3.1)
Lymphs: 42 %
MCH: 31.2 pg (ref 26.6–33.0)
MCHC: 32.5 g/dL (ref 31.5–35.7)
MCV: 96 fL (ref 79–97)
Monocytes Absolute: 0.4 10*3/uL (ref 0.1–0.9)
Monocytes: 8 %
Neutrophils Absolute: 2.6 10*3/uL (ref 1.4–7.0)
Neutrophils: 46 %
Platelets: 282 10*3/uL (ref 150–450)
RBC: 4.46 x10E6/uL (ref 3.77–5.28)
RDW: 12.5 % (ref 11.7–15.4)
WBC: 5.7 10*3/uL (ref 3.4–10.8)

## 2024-11-23 LAB — HEMOGLOBIN A1C
Est. average glucose Bld gHb Est-mCnc: 111 mg/dL
Hgb A1c MFr Bld: 5.5 (ref 4.8–5.6)

## 2024-11-23 LAB — TSH: TSH: 0.788 u[IU]/mL (ref 0.450–4.500)

## 2024-11-23 LAB — LIPID PANEL
Cholesterol, Total: 164 mg/dL (ref 100–199)
HDL: 39 mg/dL — AB
LDL CALC COMMENT:: 4.2 ratio (ref 0.0–4.4)
LDL Chol Calc (NIH): 110 mg/dL — AB (ref 0–99)
Triglycerides: 76 mg/dL (ref 0–149)
VLDL Cholesterol Cal: 15 mg/dL (ref 5–40)

## 2024-11-23 LAB — T4, FREE: Free T4: 1.43 ng/dL (ref 0.82–1.77)

## 2025-12-03 ENCOUNTER — Encounter: Admitting: Nurse Practitioner
